# Patient Record
Sex: Female | Born: 1940 | Race: Asian | Hispanic: No | Marital: Married | Smoking: Never smoker
Health system: Southern US, Community
[De-identification: ages and names within clinical notes are randomized; demographics above are authoritative.]

## PROBLEM LIST (undated history)

## (undated) DIAGNOSIS — I10 Essential (primary) hypertension: Secondary | ICD-10-CM

---

## 2012-06-26 ENCOUNTER — Encounter (HOSPITAL_COMMUNITY): Payer: Self-pay

## 2012-06-26 ENCOUNTER — Emergency Department (HOSPITAL_COMMUNITY)
Admission: EM | Admit: 2012-06-26 | Discharge: 2012-06-26 | Disposition: A | Discharge status: Home / Self Care | Payer: Medicaid Other | Source: Home / Self Care | Attending: Family Medicine | Admitting: Family Medicine

## 2012-06-26 DIAGNOSIS — M545 Low back pain: Secondary | ICD-10-CM

## 2012-06-26 LAB — POCT URINALYSIS DIP (DEVICE)
Bilirubin Urine: NEGATIVE
Glucose, UA: NEGATIVE mg/dL
Hgb urine dipstick: NEGATIVE
Ketones, ur: NEGATIVE mg/dL
Nitrite: NEGATIVE

## 2012-06-26 MED ORDER — ACETAMINOPHEN 650 MG PO TABS
1.0000 | ORAL_TABLET | Freq: Three times a day (TID) | ORAL | Status: DC | PRN
Start: 2012-06-26 — End: 2013-05-09

## 2012-06-26 MED ORDER — CYCLOBENZAPRINE HCL 5 MG PO TABS: 5.0000 mg | ORAL_TABLET | Freq: Every evening | ORAL | Status: DC | PRN

## 2012-06-26 NOTE — ED Notes (Signed)
Patient complains of right hip pain x 1 week, states sometimes she has  spasms that come and go, states that pain increases when she sits or walks for a long period of time, pain radiates all the way down leg

## 2012-06-26 NOTE — ED Provider Notes (Signed)
History     CSN: [id number]  Arrival date & time 06/26/12  0844   First MD Initiated Contact with Patient 06/26/12 [phone]      Chief Complaint  Patient presents with  . Hip Pain    (Consider location/radiation/quality/duration/timing/severity/associated sxs/prior treatment) HPI Comments: 71 year old female with no significant past medical history. Here complaining of low back pain for several months but with exacerbation during the last week. Patient does not work outside the house but helps with cooking and some home chores. She reports that she exercises daily. She does not smoke. She reports that her pain is located in bilateral low back and sometimes have radiation to the right buttock and behind right thigh. Does not affect her activities of daily living. Pain does not wake her up at night. She's not taking any medication for her symptoms. Denies weakness, tingling and numbness in her lower extremities. No stool or urinary incontinence. No dysuria or hematuria. No urinary frequency. No abdominal pain nausea vomiting or diarrhea. No fever or chills. Appetite and energy level at baseline. Patient also has a blood pressure log with normal readings.   History reviewed. No pertinent past medical history.  History reviewed. No pertinent past surgical history.  No family history on file.  History  Substance Use Topics  . Smoking status: Never Smoker   . Smokeless tobacco: Not on file  . Alcohol Use: No    OB History    Grav Para Term Preterm Abortions TAB SAB Ect Mult Living                  Review of Systems  Constitutional: Negative for fever and chills.  Respiratory: Negative for shortness of breath.   Cardiovascular: Negative for chest pain and leg swelling.  Gastrointestinal: Negative for nausea, vomiting, abdominal pain and diarrhea.  Genitourinary: Negative for dysuria, hematuria, flank pain, vaginal discharge, vaginal pain and pelvic pain.  Musculoskeletal: Positive  for back pain. Negative for gait problem.  Skin: Negative for rash.  Neurological: Negative for dizziness and headaches.  All other systems reviewed and are negative.    Allergies  Review of patient's allergies indicates no known allergies.  Home Medications   Current Outpatient Rx  Name Route Sig Dispense Refill  . ACETAMINOPHEN 650 MG PO TABS Oral Take 1 tablet (650 mg total) by mouth 3 (three) times daily between meals as needed. 30 tablet 0  . CYCLOBENZAPRINE HCL 5 MG PO TABS Oral Take 1 tablet (5 mg total) by mouth at bedtime as needed for muscle spasms. 15 tablet 0    BP 165/76  Pulse 66  Temp 98.1 F (36.7 C) (Oral)  Resp 16  SpO2 100%  Physical Exam  Nursing note and vitals reviewed. Constitutional: She is oriented to person, place, and time. She appears well-developed and well-nourished. No distress.  HENT:  Head: Normocephalic and atraumatic.  Mouth/Throat: Oropharynx is clear and moist. No oropharyngeal exudate.  Eyes: Conjunctivae normal are normal. No scleral icterus.  Neck: Neck supple. No JVD present. No thyromegaly present.  Cardiovascular: Normal rate, regular rhythm and normal heart sounds.   Pulmonary/Chest: Effort normal and breath sounds normal.  Abdominal: Soft. Bowel sounds are normal. She exhibits no distension and no mass. There is no tenderness. There is no rebound and no guarding.       No costovertebral tenderness  Musculoskeletal:       Spine central: No obvious scoliosis or kyphosis. No pain over bone processes in entire spine.  Good  range of motion.  Able to bend forward (flexion) past 90 degrees and posteriorly (extension) 25 degrees.  Normal bilateral lateralization 25 degress. Despite good range of motion there is reported pain in bilateral paravertebral lumbar muscles.   Lower extremities with normal range of motion of bilateral hips and knees. Symmetric deep tendon reflexes in lower extremity as well as muscle strength, superficial  sensation and proprioception. Both lower extremities are equally warm and distal pedal pulses are patent bilaterally.    Lymphadenopathy:    She has no cervical adenopathy.  Neurological: She is alert and oriented to person, place, and time.  Skin: No rash noted. She is not diaphoretic.    ED Course  Procedures (including critical care time)  Labs Reviewed  POCT URINALYSIS DIP (DEVICE) - Abnormal; Notable for the following:    Leukocytes, UA SMALL (*)  Biochemical Testing Only. Please order routine urinalysis from main lab if confirmatory testing is needed.   All other components within normal limits  URINE CULTURE   No results found.   1. Low back pain       MDM  71 year old otherwise healthy female here complaining of low back pain. Positive LA on point-of-care urinalysis but no urinary symptoms. Decided to send urine for culture. Patient treated with acetaminophen when necessary and Flexeril minimal dose at bedtime only when necessary. Supportive care and exercises discussed with patient and also handout for back exercises provided. We will contact patient if abnormal urine culture.  Asked to followup with primary care provider to monitor her symptoms.        Sharin Grave, MD 06/26/12 [phone]

## 2012-06-27 LAB — URINE CULTURE
Colony Count: NO GROWTH
Special Requests: NORMAL

## 2013-03-24 ENCOUNTER — Ambulatory Visit: Payer: Self-pay

## 2013-04-18 ENCOUNTER — Ambulatory Visit: Payer: Medicaid Other | Attending: Family Medicine | Admitting: Internal Medicine

## 2013-04-18 ENCOUNTER — Encounter: Payer: Self-pay | Admitting: Internal Medicine

## 2013-04-18 ENCOUNTER — Ambulatory Visit (HOSPITAL_COMMUNITY)
Admission: RE | Admit: 2013-04-18 | Discharge: 2013-04-18 | Disposition: A | Discharge status: Home / Self Care | Payer: Medicaid Other | Source: Ambulatory Visit | Attending: Internal Medicine | Admitting: Internal Medicine

## 2013-04-18 VITALS — BP 167/95 | HR 64 | Temp 98.3°F | Ht <= 58 in | Wt 109.0 lb

## 2013-04-18 DIAGNOSIS — M4120 Other idiopathic scoliosis, site unspecified: Secondary | ICD-10-CM | POA: Insufficient documentation

## 2013-04-18 DIAGNOSIS — M549 Dorsalgia, unspecified: Secondary | ICD-10-CM | POA: Insufficient documentation

## 2013-04-18 DIAGNOSIS — M25559 Pain in unspecified hip: Secondary | ICD-10-CM | POA: Insufficient documentation

## 2013-04-18 DIAGNOSIS — Z7689 Persons encountering health services in other specified circumstances: Secondary | ICD-10-CM

## 2013-04-18 DIAGNOSIS — M545 Low back pain, unspecified: Secondary | ICD-10-CM | POA: Insufficient documentation

## 2013-04-18 DIAGNOSIS — M47817 Spondylosis without myelopathy or radiculopathy, lumbosacral region: Secondary | ICD-10-CM | POA: Insufficient documentation

## 2013-04-18 DIAGNOSIS — Z7189 Other specified counseling: Secondary | ICD-10-CM

## 2013-04-18 DIAGNOSIS — M948X9 Other specified disorders of cartilage, unspecified sites: Secondary | ICD-10-CM | POA: Insufficient documentation

## 2013-04-18 LAB — LIPID PANEL
Cholesterol: 228 mg/dL — ABNORMAL HIGH (ref 0–200)
HDL: 60 mg/dL (ref >39)
Triglycerides: 161 mg/dL — ABNORMAL HIGH (ref <150)
VLDL: 32 mg/dL (ref 0–40)

## 2013-04-18 LAB — COMPREHENSIVE METABOLIC PANEL
AST: 14 U/L (ref 0–37)
Alkaline Phosphatase: 45 U/L (ref 39–117)
BUN: 14 mg/dL (ref 6–23)
Calcium: 9.1 mg/dL (ref 8.4–10.5)
Chloride: 107 mEq/L (ref 96–112)
Creat: 0.81 mg/dL (ref 0.50–1.10)
Total Bilirubin: 0.5 mg/dL (ref 0.3–1.2)

## 2013-04-18 MED ORDER — MELOXICAM 15 MG PO TABS: 15.0000 mg | ORAL_TABLET | Freq: Every day | ORAL | Status: DC

## 2013-04-18 NOTE — Progress Notes (Signed)
Patient ID: Rebecca Weeks, female   DOB: [birth year], 72 y.o.   MRN: [id number]  CC:  HPI: 72 year old female who is here to establish care. She primarily complains of back pain originating in her left buttock and radiating into her left leg. She occasionally has a burning sensation back no numbness or tingling in her leg. Her gait is normal. The patient denies any history of trauma to her back. The patient states that she has difficulty and discomfort when she is sitting for prolonged periods of time. Rest of review of systems is negative  No Known Allergies No past medical history on file. Current Outpatient Prescriptions on File Prior to Visit  Medication Sig Dispense Refill  . Acetaminophen 650 MG TABS Take 1 tablet (650 mg total) by mouth 3 (three) times daily between meals as needed.  30 tablet  0  . cyclobenzaprine (FLEXERIL) 5 MG tablet Take 1 tablet (5 mg total) by mouth at bedtime as needed for muscle spasms.  15 tablet  0   No current facility-administered medications on file prior to visit.   No family history on file. History   Social History  . Marital Status: Married    Spouse Name: N/A    Number of Children: N/A  . Years of Education: N/A   Occupational History  . Not on file.   Social History Main Topics  . Smoking status: Never Smoker   . Smokeless tobacco: Not on file  . Alcohol Use: No  . Drug Use: No  . Sexual Activity:    Other Topics Concern  . Not on file   Social History Narrative  . No narrative on file    Review of Systems  Constitutional: Negative for fever, chills, diaphoresis, activity change, appetite change and fatigue.  HENT: Negative for ear pain, nosebleeds, congestion, facial swelling, rhinorrhea, neck pain, neck stiffness and ear discharge.   Eyes: Negative for pain, discharge, redness, itching and visual disturbance.  Respiratory: Negative for cough, choking, chest tightness, shortness of breath, wheezing and stridor.   Cardiovascular:  Negative for chest pain, palpitations and leg swelling.  Gastrointestinal: Negative for abdominal distention.  Genitourinary: Negative for dysuria, urgency, frequency, hematuria, flank pain, decreased urine volume, difficulty urinating and dyspareunia.  Musculoskeletal: Negative for back pain, joint swelling, arthralgias and gait problem.  Neurological: Negative for dizziness, tremors, seizures, syncope, facial asymmetry, speech difficulty, weakness, light-headedness, numbness and headaches.  Hematological: Negative for adenopathy. Does not bruise/bleed easily.  Psychiatric/Behavioral: Negative for hallucinations, behavioral problems, confusion, dysphoric mood, decreased concentration and agitation.    Objective:   Filed Vitals:   04/18/13 1011  BP: 167/95  Pulse: 64  Temp: 98.3 F (36.8 C)    Physical Exam  Constitutional: Appears well-developed and well-nourished. No distress.  HENT: Normocephalic. External right and left ear normal. Oropharynx is clear and moist.  Eyes: Conjunctivae and EOM are normal. PERRLA, no scleral icterus.  Neck: Normal ROM. Neck supple. No JVD. No tracheal deviation. No thyromegaly.  CVS: RRR, S1/S2 +, no murmurs, no gallops, no carotid bruit.  Pulmonary: Effort and breath sounds normal, no stridor, rhonchi, wheezes, rales.  Abdominal: Soft. BS +,  no distension, tenderness, rebound or guarding.  Musculoskeletal: Normal range of motion. No edema and no tenderness.  Lymphadenopathy: No lymphadenopathy noted, cervical, inguinal. Neuro: Alert. Normal reflexes, muscle tone coordination. No cranial nerve deficit. Skin: Skin is warm and dry. No rash noted. Not diaphoretic. No erythema. No pallor.  Psychiatric: Normal mood and affect. Behavior, judgment, thought  content normal.   No results found for this basename: WBC, HGB, HCT, MCV, PLT   No results found for this basename: CREATININE, BUN, NA, K, CL, CO2    No results found for this basename: HGBA1C    Lipid Panel  No results found for this basename: chol, trig, hdl, cholhdl, vldl, ldlcalc       Assessment and plan:   There are no active problems to display for this patient.  Back pain Obtain x-rays of the pelvis and the lumbar spine Start the patient on meloxicam   Obtain baseline labs today and have the patient come back in one week to review them Patient has never seen a primary care provider Therefore we'll schedule her for a mammography and therefore her to gastroenterology for colonoscopy

## 2013-04-19 LAB — CBC WITH DIFFERENTIAL/PLATELET
Basophils Absolute: 0 10*3/uL (ref 0.0–0.1)
Basophils Relative: 1 % (ref 0–1)
Eosinophils Absolute: 0.5 10*3/uL (ref 0.0–0.7)
Eosinophils Relative: 9 % — ABNORMAL HIGH (ref 0–5)
HCT: 34.7 % — ABNORMAL LOW (ref 36.0–46.0)
MCHC: 34.3 g/dL (ref 30.0–36.0)
MCV: 65.5 fL — ABNORMAL LOW (ref 78.0–100.0)
Monocytes Absolute: 0.2 10*3/uL (ref 0.1–1.0)
Platelets: 242 10*3/uL (ref 150–400)
RDW: 17.2 % — ABNORMAL HIGH (ref 11.5–15.5)

## 2013-04-24 ENCOUNTER — Encounter: Payer: Self-pay | Admitting: Internal Medicine

## 2013-04-25 ENCOUNTER — Ambulatory Visit: Payer: Medicaid Other

## 2013-05-08 ENCOUNTER — Ambulatory Visit: Payer: Medicaid Other | Attending: Internal Medicine

## 2013-05-09 ENCOUNTER — Ambulatory Visit: Payer: Medicaid Other | Attending: Internal Medicine

## 2013-05-09 VITALS — BP 176/74

## 2013-05-09 DIAGNOSIS — M549 Dorsalgia, unspecified: Secondary | ICD-10-CM

## 2013-05-09 DIAGNOSIS — E559 Vitamin D deficiency, unspecified: Secondary | ICD-10-CM | POA: Insufficient documentation

## 2013-05-09 DIAGNOSIS — E785 Hyperlipidemia, unspecified: Secondary | ICD-10-CM | POA: Insufficient documentation

## 2013-05-09 DIAGNOSIS — I10 Essential (primary) hypertension: Secondary | ICD-10-CM | POA: Insufficient documentation

## 2013-05-09 MED ORDER — AMLODIPINE BESYLATE 5 MG PO TABS: 5.0000 mg | ORAL_TABLET | Freq: Every day | ORAL | Status: DC

## 2013-05-09 MED ORDER — LOVASTATIN 20 MG PO TABS: 20.0000 mg | ORAL_TABLET | Freq: Every day | ORAL | Status: AC

## 2013-05-09 MED ORDER — VITAMIN D (ERGOCALCIFEROL) 1.25 MG (50000 UNIT) PO CAPS: 50000.0000 [IU] | ORAL_CAPSULE | ORAL | Status: AC

## 2013-05-09 MED ORDER — CYCLOBENZAPRINE HCL 5 MG PO TABS: 5.0000 mg | ORAL_TABLET | Freq: Two times a day (BID) | ORAL | Status: AC | PRN

## 2013-05-09 MED ORDER — CLONIDINE HCL 0.1 MG PO TABS
0.1000 mg | ORAL_TABLET | Freq: Once | ORAL | Status: AC
  Administered 2013-05-09: 0.1 mg via ORAL

## 2013-05-09 MED ORDER — TRAMADOL HCL 50 MG PO TABS: 50.0000 mg | ORAL_TABLET | Freq: Four times a day (QID) | ORAL | Status: DC | PRN

## 2013-05-09 NOTE — Progress Notes (Unsigned)
Rechecked BP = 176/74... Dr. Isidoro Donning has been adv of new results... Via interpreter, Pt reports feeling better... Per Dr. Isidoro Donning, pt is to wait 10 minutes and then she can leave... Pt verbalized understanding.

## 2013-05-09 NOTE — Progress Notes (Unsigned)
Patient ID: Rebecca Weeks, female   DOB: Jan 15, 1941, 72 y.o.   MRN: [id number] Patient Demographics  Rebecca Weeks, is a 72 y.o. female  WUJ:[id number]  NFA:[id number]  DOB - April 19, 1941  No chief complaint on file.       Subjective:   Rebecca Weeks today is here for a follow up visit.  Patient has No headache, No chest pain, No abdominal pain - No Nausea, No new weakness tingling or numbness, No Cough - SOB.   States her back pain has been still going on and that the pain medications are not working. No numbness or tingling in the right. X-rays reviewed with the patient.  Objective:    There were no vitals filed for this visit.   ALLERGIES:  No Known Allergies  PAST MEDICAL HISTORY: No past medical history on file.  MEDICATIONS AT HOME: Prior to Admission medications   Medication Sig Start Date End Date Taking? Authorizing Provider  cyclobenzaprine (FLEXERIL) 5 MG tablet Take 1 tablet (5 mg total) by mouth 2 (two) times daily as needed for muscle spasms (take one tab in AM and at bed time as needed). 05/09/13   Ripudeep Jenna Luo, MD  meloxicam (MOBIC) 15 MG tablet Take 1 tablet (15 mg total) by mouth daily. 04/18/13   Richarda Overlie, MD  traMADol (ULTRAM) 50 MG tablet Take 1 tablet (50 mg total) by mouth every 6 (six) hours as needed for pain. 05/09/13   Ripudeep Jenna Luo, MD  Vitamin D, Ergocalciferol, (DRISDOL) 50000 UNITS CAPS capsule Take 1 capsule (50,000 Units total) by mouth every 7 (seven) days. 05/09/13   Ripudeep Jenna Luo, MD     Exam  General appearance :Awake, alert, NAD, Speech Clear.  HEENT: Atraumatic and Normocephalic, PERLA Neck: supple, no JVD. No cervical lymphadenopathy.  Chest: Clear to auscultation bilaterally, no wheezing, rales or rhonchi CVS: S1 S2 regular, no murmurs.  Abdomen: soft, NBS, NT, ND, no gaurding, rigidity or rebound. Extremities: no cyanosis or clubbing, B/L Lower Ext shows no edema. SLR test negative Neurology: Awake alert, and oriented X 3, CN II-XII intact, Non  focal Skin: No Rash or lesions Wounds:N/A    Data Review   Basic Metabolic Panel: No results found for this basename: NA, K, CL, CO2, GLUCOSE, BUN, CREATININE, CALCIUM, MG, PHOS,  in the last 168 hours Liver Function Tests: No results found for this basename: AST, ALT, ALKPHOS, BILITOT, PROT, ALBUMIN,  in the last 168 hours  CBC: No results found for this basename: WBC, NEUTROABS, HGB, HCT, MCV, PLT,  in the last 168 hours  ------------------------------------------------------------------------------------------------------------------ No results found for this basename: HGBA1C,  in the last 72 hours ------------------------------------------------------------------------------------------------------------------ No results found for this basename: CHOL, HDL, LDLCALC, TRIG, CHOLHDL, LDLDIRECT,  in the last 72 hours ------------------------------------------------------------------------------------------------------------------ No results found for this basename: TSH, T4TOTAL, FREET3, T3FREE, THYROIDAB,  in the last 72 hours ------------------------------------------------------------------------------------------------------------------ No results found for this basename: VITAMINB12, FOLATE, FERRITIN, TIBC, IRON, RETICCTPCT,  in the last 72 hours  Coagulation profile  No results found for this basename: INR, PROTIME,  in the last 168 hours    Assessment & Plan   Active Problems: Back pain with hip pain and right leg pain: No significant improvement X-rays of the lumbar spine and pelvic x-rays reviewed with the patient - No acute fracture or dislocation, likely Osteoarthritis  - Placed on tramadol as needed, Flexeril twice a day as needed - Ambulate referred to orthopedics sent  Hyperlipidemia - Cholesterol 228 triglycerides 161, LDL 136  reviewed with the patient  - Start on Mevacor 20 mg daily   Vitamin D deficiency: - Vitamin D level 22, started on vitamin D 50,000  units weekly, continue for 8 weeks then recheck the levels and change to maintenance dose.  Hypertension: BP 159/102. Patient having some headache as well - Will give clonidine 0.1 mg x1 - Start patient on Norvasc 5 mg daily  Recommendations: Clonidine 0.1 mg x1  Follow-up in2 months     RAI,RIPUDEEP M.D. 05/09/2013, 2:43 PM

## 2013-05-20 ENCOUNTER — Ambulatory Visit: Payer: Medicaid Other | Admitting: Internal Medicine

## 2013-05-21 ENCOUNTER — Ambulatory Visit (HOSPITAL_COMMUNITY): Payer: Medicaid Other

## 2013-05-21 ENCOUNTER — Telehealth: Payer: Self-pay | Admitting: Internal Medicine

## 2013-05-21 NOTE — Telephone Encounter (Signed)
Message copied by Arna Snipe on Wed May 21, 2013  3:55 PM ------      Message from: Richardson Chiquito      Created: Tue May 20, 2013  1:41 PM                   ----- Message -----         From: Hart Carwin, MD         Sent: 05/20/2013  12:04 PM           To: Richardson Chiquito, CMA            Please charge no show      ----- Message -----         From: Richardson Chiquito, CMA         Sent: 05/20/2013  10:39 AM           To: Hart Carwin, MD            Patient no showed appointment with Dr Juanda Chance on 05/20/16. Dr Juanda Chance, do you want to charge no show fee?       ------

## 2014-01-06 ENCOUNTER — Other Ambulatory Visit: Payer: Self-pay | Admitting: Internal Medicine

## 2016-06-28 ENCOUNTER — Telehealth: Payer: Self-pay

## 2016-06-28 NOTE — Telephone Encounter (Signed)
Patient called interpreter Diu Hartshorn to state [facility] [place]  She is out of BP medication and is having pain in stomach and Rt. Hip.  CN called for appointment but will have to call back because the computer system was down.  Brantley Flingarolyn O'Brien RN, Congregational Nurse [phone]

## 2016-06-29 ENCOUNTER — Telehealth: Payer: Self-pay

## 2016-06-29 NOTE — Telephone Encounter (Signed)
CN called [facility] And scheduled appointment for 07/12/2016 @ 3:15 pm.  Also notified interpreter Diu Hartshorn who will accompany patient to appointment.  Brantley Flingarolyn O'Brien RN, Congregational Nurse [phone]

## 2016-11-08 NOTE — Congregational Nurse Program (Signed)
Congregational Nurse Program Note  Date of Encounter: 11/08/2016  Past Medical History: No past medical history on file.  Encounter Details:  Patient came to CN office because he is having problems with his lower dentures.  States they hurt and do not fit properly.  He is only able to eat soft foods.  He does not know where he originally obtained his dentures because he was taken there by an interpreter.  His PCP referred him to " a Education officer, communitydentist on [address]  Phone call to Dr. Aline BrochureStacey Greene's office.  An appointment was scheduled for 11/15/2016 at 2:30 pm to evaluate problem.  Brantley FlingCarolyn O'Brien RN, Congregational Nurse [phone]     CNP Questionnaire - 11/08/16 1916      Patient Demographics   Is this a new or existing patient? Existing   Patient is considered a/an Refugee   Race Asian     Patient Assistance   Location of Patient Assistance Not Applicable   Patient's financial/insurance status Medicaid;Medicare   Uninsured Patient ([employer] officer, trade unionCard/[employer]s) No   Patient referred to apply for the following financial assistance Not Applicable   Food insecurities addressed Not Applicable   Transportation assistance No   Assistance securing medications No   Educational health offerings Not Applicable     Encounter Details   Primary purpose of visit Navigating the Healthcare System   Was an Emergency Department visit averted? No   Does patient have a medical provider? Yes   Patient referred to Follow up with established PCP;Not Applicable   Was a mental health screening completed? (GAINS tool) No   Does patient have dental issues? No   Does patient have vision issues? No   Does your patient have an abnormal blood pressure today? No   Since previous encounter, have you referred patient for abnormal blood pressure that resulted in a new diagnosis or medication change? No   Does your patient have an abnormal blood glucose today? No   Since previous encounter, have you referred patient  for abnormal blood glucose that resulted in a new diagnosis or medication change? No   Was there a life-saving intervention made? No

## 2019-01-15 ENCOUNTER — Emergency Department (HOSPITAL_COMMUNITY): Payer: Medicare Other

## 2019-01-15 ENCOUNTER — Inpatient Hospital Stay (HOSPITAL_COMMUNITY)
Admission: EM | Admit: 2019-01-15 | Discharge: 2019-01-18 | DRG: 177 | Disposition: A | Discharge status: Home / Self Care | Payer: Medicare Other | Attending: Internal Medicine | Admitting: Internal Medicine

## 2019-01-15 ENCOUNTER — Other Ambulatory Visit: Payer: Self-pay

## 2019-01-15 DIAGNOSIS — J1289 Other viral pneumonia: Secondary | ICD-10-CM | POA: Diagnosis present

## 2019-01-15 DIAGNOSIS — R509 Fever, unspecified: Secondary | ICD-10-CM | POA: Diagnosis present

## 2019-01-15 DIAGNOSIS — Z79899 Other long term (current) drug therapy: Secondary | ICD-10-CM | POA: Diagnosis not present

## 2019-01-15 DIAGNOSIS — Z791 Long term (current) use of non-steroidal anti-inflammatories (NSAID): Secondary | ICD-10-CM | POA: Diagnosis not present

## 2019-01-15 DIAGNOSIS — I10 Essential (primary) hypertension: Secondary | ICD-10-CM | POA: Diagnosis present

## 2019-01-15 DIAGNOSIS — J129 Viral pneumonia, unspecified: Secondary | ICD-10-CM

## 2019-01-15 DIAGNOSIS — Z20822 Contact with and (suspected) exposure to covid-19: Secondary | ICD-10-CM | POA: Diagnosis present

## 2019-01-15 DIAGNOSIS — Z79891 Long term (current) use of opiate analgesic: Secondary | ICD-10-CM

## 2019-01-15 DIAGNOSIS — U071 COVID-19: Secondary | ICD-10-CM | POA: Diagnosis present

## 2019-01-15 HISTORY — DX: Essential (primary) hypertension: I10

## 2019-01-15 LAB — COMPREHENSIVE METABOLIC PANEL
ALT: 25 U/L (ref 0–44)
AST: 36 U/L (ref 15–41)
Albumin: 3.4 g/dL — ABNORMAL LOW (ref 3.5–5.0)
Alkaline Phosphatase: 56 U/L (ref 38–126)
Anion gap: 11 (ref 5–15)
BUN: 16 mg/dL (ref 8–23)
CO2: 21 mmol/L — ABNORMAL LOW (ref 22–32)
Calcium: 8.3 mg/dL — ABNORMAL LOW (ref 8.9–10.3)
Chloride: 105 mmol/L (ref 98–111)
Creatinine, Ser: 0.93 mg/dL (ref 0.44–1.00)
GFR calc Af Amer: >60 mL/min (ref >60)
GFR calc non Af Amer: 59 mL/min — ABNORMAL LOW (ref >60)
Glucose, Bld: 122 mg/dL — ABNORMAL HIGH (ref 70–99)
Potassium: 2.9 mmol/L — ABNORMAL LOW (ref 3.5–5.1)
Sodium: 137 mmol/L (ref 135–145)
Total Bilirubin: 0.5 mg/dL (ref 0.3–1.2)
Total Protein: 6.8 g/dL (ref 6.5–8.1)

## 2019-01-15 LAB — CBC
HCT: 31.4 % — ABNORMAL LOW (ref 36.0–46.0)
Hemoglobin: 10.9 g/dL — ABNORMAL LOW (ref 12.0–15.0)
MCH: 22.8 pg — ABNORMAL LOW (ref 26.0–34.0)
MCHC: 34.7 g/dL (ref 30.0–36.0)
MCV: 65.7 fL — ABNORMAL LOW (ref 80.0–100.0)
Platelets: 170 10*3/uL (ref 150–400)
RBC: 4.78 MIL/uL (ref 3.87–5.11)
RDW: 15 % (ref 11.5–15.5)
WBC: 6 10*3/uL (ref 4.0–10.5)
nRBC: 0 % (ref 0.0–0.2)

## 2019-01-15 LAB — SARS CORONAVIRUS 2 BY RT PCR (HOSPITAL ORDER, PERFORMED IN ~~LOC~~ HOSPITAL LAB): SARS Coronavirus 2: NEGATIVE

## 2019-01-15 MED ORDER — POTASSIUM CHLORIDE CRYS ER 20 MEQ PO TBCR
40.0000 meq | EXTENDED_RELEASE_TABLET | Freq: Once | ORAL | Status: DC
  Filled 2019-01-15: qty 2

## 2019-01-15 MED ORDER — OXYCODONE HCL 5 MG PO TABS
5.0000 mg | ORAL_TABLET | Freq: Once | ORAL | Status: AC
  Administered 2019-01-15: 5 mg via ORAL
  Filled 2019-01-15: qty 1

## 2019-01-15 MED ORDER — SODIUM CHLORIDE 0.9 % IV BOLUS
1000.0000 mL | Freq: Once | INTRAVENOUS | Status: AC
  Administered 2019-01-16: 1000 mL via INTRAVENOUS

## 2019-01-15 NOTE — ED Notes (Signed)
[phone] - Diu..... translates if needed

## 2019-01-15 NOTE — ED Triage Notes (Signed)
Pt reports with fever and right leg pain for 3 days. Husband went to [place] yesterday with COVID. Pt denies CP, SOB and cough

## 2019-01-15 NOTE — ED Provider Notes (Signed)
[facility] Emergency Department Provider Note MRN:  [id number]  Arrival date & time: 01/16/19     Chief Complaint   Fever   History of Present Illness   Rebecca Weeks is a 78 y.o. year-old female with no pertinent past medical history presenting to the ED with chief complaint of fever.  2 to 3 days of persistent fever, general malaise.  History obtained with the help of a family friend over the phone who is able to interpret.  Patient denying cough, no shortness of breath, no chest pain.  Endorsing right lower quadrant/right flank/right hip pain.  Patient has had chronic hip and leg pain for several years, but this is different and/or worse.  Denies burning with urination, no bowel or bladder dysfunction, no numbness or weakness to the arms or legs.  Symptoms are constant, moderate, no exacerbating relieving factors.  Review of Systems  A complete 10 system review of systems was obtained and all systems are negative except as noted in the HPI and PMH.   Patient's Health History   No past medical history on file.  No past surgical history on file.  No family history on file.  Social History   Socioeconomic History  . Marital status: Married    Spouse name: Not on file  . Number of children: Not on file  . Years of education: Not on file  . Highest education level: Not on file  Occupational History  . Not on file  Social Needs  . Financial resource strain: Not on file  . Food insecurity:    Worry: Not on file    Inability: Not on file  . Transportation needs:    Medical: Not on file    Non-medical: Not on file  Tobacco Use  . Smoking status: Never Smoker  Substance and Sexual Activity  . Alcohol use: No  . Drug use: No  . Sexual activity: Not on file  Lifestyle  . Physical activity:    Days per week: Not on file    Minutes per session: Not on file  . Stress: Not on file  Relationships  . Social connections:    Talks on phone: Not on file    Gets  together: Not on file    Attends religious service: Not on file    Active member of club or organization: Not on file    Attends meetings of clubs or organizations: Not on file    Relationship status: Not on file  . Intimate partner violence:    Fear of current or ex partner: Not on file    Emotionally abused: Not on file    Physically abused: Not on file    Forced sexual activity: Not on file  Other Topics Concern  . Not on file  Social History Narrative  . Not on file     Physical Exam  Vital Signs and Nursing Notes reviewed Vitals:   01/15/19 2026  BP: 124/74  Pulse: (!) 104  Resp: 20  Temp: 99.5 F (37.5 C)  SpO2: 94%    CONSTITUTIONAL: Well-appearing, NAD NEURO:  Alert and oriented x 3, no focal deficits EYES:  eyes equal and reactive ENT/NECK:  no LAD, no JVD CARDIO: Regular rate, well-perfused, normal S1 and S2 PULM:  CTAB no wheezing or rhonchi GI/GU:  normal bowel sounds, non-distended, non-tender MSK/SPINE:  No gross deformities, no edema SKIN:  no rash, atraumatic PSYCH:  Appropriate speech and behavior  Diagnostic and Interventional Summary  EKG Interpretation  Date/Time:  Wednesday Jan 15 2019 20:24:53 EDT Ventricular Rate:  93 PR Interval:    QRS Duration: 86 QT Interval:  443 QTC Calculation: 552 R Axis:   45 Text Interpretation:  Sinus rhythm Ventricular premature complex Aberrant conduction of SV complex(es) Borderline short PR interval Nonspecific T abnormalities, lateral leads Prolonged QT interval Confirmed by Kennis CarinaBero, Sherrise Liberto [phone]([id number]) on 01/15/2019 8:44:34 PM      Labs Reviewed  CBC - Abnormal; Notable for the following components:      Result Value   Hemoglobin 10.9 (*)    HCT 31.4 (*)    MCV 65.7 (*)    MCH 22.8 (*)    All other components within normal limits  COMPREHENSIVE METABOLIC PANEL - Abnormal; Notable for the following components:   Potassium 2.9 (*)    CO2 21 (*)    Glucose, Bld 122 (*)    Calcium 8.3 (*)    Albumin 3.4  (*)    GFR calc non Af Amer 59 (*)    All other components within normal limits  SARS CORONAVIRUS 2 (HOSPITAL ORDER, PERFORMED IN [place] [facility])  URINALYSIS, ROUTINE W REFLEX MICROSCOPIC    CT ABDOMEN PELVIS WO CONTRAST  Final Result    DG Chest Port 1 View  Final Result      Medications  potassium chloride SA (K-DUR) CR tablet 40 mEq (has no administration in time range)  sodium chloride 0.9 % bolus 1,000 mL (has no administration in time range)  oxyCODONE (Oxy IR/ROXICODONE) immediate release tablet 5 mg (5 mg Oral Given 01/15/19 2215)     Procedures Critical Care  ED Course and Medical Decision Making  I have reviewed the triage vital signs and the nursing notes.  Pertinent labs & imaging results that were available during my care of the patient were reviewed by me and considered in my medical decision making (see below for details).  Favoring coronavirus infection causing the fever, but also considering UTI, pyelonephritis, infected stone, less likely appendicitis.  Patient is without any tenderness or swelling to the right lower extremity, nothing to suggest infection, no evidence of DVT, no concern for septic joint.  Awaiting labs, chest x-ray, CT abdomen.  CT is unremarkable.  Still awaiting urinalysis.  Patient became lightheaded upon standing, will provide IV fluids and reassess ambulation status.  Plan discussed with the help of her family friend who interpreted.  Patient made aware of her low potassium, her ovarian cyst that needs outpatient follow-up ultrasound.  Signed out to oncoming provider at shift change.  Elmer SowMichael M. Pilar PlateBero, MD [facility] [email or web address]  Final Clinical Impressions(s) / ED Diagnoses     ICD-10-CM   1. Viral pneumonia J12.9   2. Fever R50.9 [facility]    DG Chest Port 1 View    CANCELED: DG Chest 2 View    CANCELED: DG Chest 2 View    ED Discharge Orders    None          Sabas SousBero, Lenus Trauger M, MD 01/16/19 [phone]

## 2019-01-15 NOTE — ED Notes (Signed)
Pt reports speaking [place], different from [place]

## 2019-01-16 ENCOUNTER — Encounter (HOSPITAL_COMMUNITY): Payer: Self-pay | Admitting: Internal Medicine

## 2019-01-16 DIAGNOSIS — Z791 Long term (current) use of non-steroidal anti-inflammatories (NSAID): Secondary | ICD-10-CM | POA: Diagnosis not present

## 2019-01-16 DIAGNOSIS — Z79899 Other long term (current) drug therapy: Secondary | ICD-10-CM | POA: Diagnosis not present

## 2019-01-16 DIAGNOSIS — Z20822 Contact with and (suspected) exposure to covid-19: Secondary | ICD-10-CM | POA: Diagnosis present

## 2019-01-16 DIAGNOSIS — Z79891 Long term (current) use of opiate analgesic: Secondary | ICD-10-CM | POA: Diagnosis not present

## 2019-01-16 DIAGNOSIS — R6889 Other general symptoms and signs: Secondary | ICD-10-CM | POA: Diagnosis not present

## 2019-01-16 DIAGNOSIS — I10 Essential (primary) hypertension: Secondary | ICD-10-CM

## 2019-01-16 DIAGNOSIS — R509 Fever, unspecified: Secondary | ICD-10-CM | POA: Diagnosis present

## 2019-01-16 DIAGNOSIS — J1289 Other viral pneumonia: Secondary | ICD-10-CM | POA: Diagnosis not present

## 2019-01-16 DIAGNOSIS — U071 COVID-19: Secondary | ICD-10-CM | POA: Diagnosis not present

## 2019-01-16 LAB — C-REACTIVE PROTEIN: CRP: 5.1 mg/dL — ABNORMAL HIGH (ref <1.0)

## 2019-01-16 LAB — ABO/RH: ABO/RH(D): A POS

## 2019-01-16 LAB — SARS CORONAVIRUS 2 BY RT PCR (HOSPITAL ORDER, PERFORMED IN ~~LOC~~ HOSPITAL LAB): SARS Coronavirus 2: POSITIVE — AB

## 2019-01-16 LAB — PROCALCITONIN: Procalcitonin: 0.14 ng/mL

## 2019-01-16 LAB — URINALYSIS, ROUTINE W REFLEX MICROSCOPIC
Bilirubin Urine: NEGATIVE
Glucose, UA: NEGATIVE mg/dL
Hgb urine dipstick: NEGATIVE
Ketones, ur: 5 mg/dL — AB
Leukocytes,Ua: NEGATIVE
Nitrite: NEGATIVE
Protein, ur: NEGATIVE mg/dL
Specific Gravity, Urine: 1.011 (ref 1.005–1.030)
pH: 6 (ref 5.0–8.0)

## 2019-01-16 LAB — TYPE AND SCREEN
ABO/RH(D): A POS
Antibody Screen: NEGATIVE

## 2019-01-16 LAB — D-DIMER, QUANTITATIVE: D-Dimer, Quant: 0.64 ug/mL-FEU — ABNORMAL HIGH (ref 0.00–0.50)

## 2019-01-16 MED ORDER — GUAIFENESIN-DM 100-10 MG/5ML PO SYRP
10.0000 mL | ORAL_SOLUTION | ORAL | Status: DC | PRN
  Administered 2019-01-16 – 2019-01-17 (×2): 10 mL via ORAL
  Filled 2019-01-16 (×2): qty 10

## 2019-01-16 MED ORDER — POTASSIUM CHLORIDE CRYS ER 20 MEQ PO TBCR
40.0000 meq | EXTENDED_RELEASE_TABLET | Freq: Once | ORAL | Status: AC
  Administered 2019-01-16: 40 meq via ORAL
  Filled 2019-01-16: qty 2

## 2019-01-16 MED ORDER — ONDANSETRON HCL 4 MG/2ML IJ SOLN
4.0000 mg | Freq: Once | INTRAMUSCULAR | Status: AC
  Administered 2019-01-16: 4 mg via INTRAVENOUS
  Filled 2019-01-16: qty 2

## 2019-01-16 MED ORDER — HYDROCOD POLST-CPM POLST ER 10-8 MG/5ML PO SUER: 5.0000 mL | Freq: Two times a day (BID) | ORAL | Status: DC | PRN

## 2019-01-16 MED ORDER — ONDANSETRON HCL 4 MG/2ML IJ SOLN: 4.0000 mg | Freq: Four times a day (QID) | INTRAMUSCULAR | Status: DC | PRN

## 2019-01-16 MED ORDER — ONDANSETRON HCL 4 MG PO TABS: 4.0000 mg | ORAL_TABLET | Freq: Four times a day (QID) | ORAL | Status: DC | PRN

## 2019-01-16 MED ORDER — POTASSIUM CHLORIDE 10 MEQ/100ML IV SOLN
10.0000 meq | INTRAVENOUS | Status: AC
  Administered 2019-01-16 (×4): 10 meq via INTRAVENOUS
  Filled 2019-01-16 (×4): qty 100

## 2019-01-16 MED ORDER — SODIUM CHLORIDE 0.9 % IV SOLN
INTRAVENOUS | Status: DC
  Administered 2019-01-16 (×2): via INTRAVENOUS

## 2019-01-16 MED ORDER — ACETAMINOPHEN 325 MG PO TABS
650.0000 mg | ORAL_TABLET | Freq: Four times a day (QID) | ORAL | Status: DC | PRN
  Administered 2019-01-16 – 2019-01-18 (×4): 650 mg via ORAL
  Filled 2019-01-16 (×5): qty 2

## 2019-01-16 MED ORDER — ENOXAPARIN SODIUM 40 MG/0.4ML ~~LOC~~ SOLN
40.0000 mg | Freq: Every day | SUBCUTANEOUS | Status: DC
  Administered 2019-01-16 – 2019-01-18 (×3): 40 mg via SUBCUTANEOUS
  Filled 2019-01-16 (×3): qty 0.4

## 2019-01-16 NOTE — Progress Notes (Signed)
This is a no charge note as patient admitted earlier today by Dr. Julian Reil  78 year old female admitted this morning for nausea, vomiting.  Her husband currently at [facility] for COVID-19 initial testing where negative for COVID-19, but repeat test came back positive, complaint was nausea and vomiting on presentation, CT abdomen pelvis with no acute finding in the abdomen , but significant for pulmonary finding suspicious of viral pneumonia related to COVID-19 .  Laying In bed, no apparent distress  Lungs Clear to auscultation, no wheezing or rails  Regular Rate and rhythm, no rubs or gallops  Abd Soft, nontender, nondistended  Ext with No Edema, clubbing or cyanosis   Patient saturating 96% on room air, and nausea subsided, tolerated her diet earlier today, continue to follow inflammatory markers, so far no indication for steroids or Skipper Cliche, will continue to monitor closely.  Time spent : No Charge   Huey Bienenstock MD

## 2019-01-16 NOTE — Progress Notes (Addendum)
78 year old female admitted this morning for nausea, vomiting. Her husband was recently tested positive for COVID 19. See detailed H&P by Dr. Julian Reil Initial SARS coronavirus 2 test was negative in the ED.   Repeat testing performed on the floor showed  SARS coronavirus 2  positive,  D dimer 0.64, CRP 5.1. Patient is not requiring oxygen,  O2 sats 96% on room air. CT abd/pelvis showed bilateral infiltrates in lungs Will obtain procalcitonin this morning. Potassium was replaced last night, today's labs are pending.  We will transfer the patient to [facility] Patient will Karlynn seen and examined at [facility] and discussed with with my colleague Dr. Randol Kern

## 2019-01-16 NOTE — ED Notes (Signed)
Pt ambulated to the bathroom but became very dizzy and vomited. Provider notified. IV replaced and fluids given. Pt refusing PO meds until nausea is under control, provider notified.

## 2019-01-16 NOTE — H&P (Signed)
History and Physical    Rebecca Weeks HLK:[id number] DOB: [birth year] DOA: 01/15/2019  PCP: Patient, [name]  Patient coming from: Home  I have personally briefly reviewed patient's old medical records in [facility]  Chief Complaint: Fever  HPI: Rebecca Weeks is a 78 y.o. female with medical history significant of HTN, though her BP looks okay today and she doesn't take any of her BP meds at baseline it seems.  Patient presents to the ED with 2-3 day history of fever, malaise.  Denies cough or SOB.  Does have nausea, vomiting, and lightheadedness.  Husband has confirmed COVID-19 and is currently admitted to [facility].   ED Course: CT abd/pelvis is significant for B PNA findings worrisome for COVID-19 (see report), otherwise negative.  COVID-19 test is surprisingly negative.  Patient has dizziness and vomiting when ambulating to bathroom.  History taken with help of translator over phone, patient speaks [place] Rhade   Review of Systems: As per HPI otherwise 10 point review of systems negative.   Past Medical History:  Diagnosis Date   HTN (hypertension)     History reviewed. No pertinent surgical history.   reports that she has never smoked. She does not have any smokeless tobacco history on file. She reports that she does not drink alcohol or use drugs.  No Known Allergies  No family history on file. Husband has COVID-19 currently  Prior to Admission medications   Medication Sig Start Date End Date Taking? Authorizing Provider  acetaminophen (TYLENOL) 650 MG CR tablet Take 650 mg by mouth every 8 (eight) hours as needed for pain.   Yes [provider]  amLODipine (NORVASC) 5 MG tablet Take 1 tablet (5 mg total) by mouth daily. Patient not taking: Reported on 01/15/2019 05/09/13   Rai, Delene Ruffini, MD  cyclobenzaprine (FLEXERIL) 5 MG tablet Take 1 tablet (5 mg total) by mouth 2 (two) times daily as needed for muscle spasms (take one tab in AM and at bed time as  needed). Patient not taking: Reported on 01/15/2019 05/09/13   Rai, Delene Ruffini, MD  lovastatin (MEVACOR) 20 MG tablet Take 1 tablet (20 mg total) by mouth at bedtime. Patient not taking: Reported on 01/15/2019 05/09/13   Rai, Delene Ruffini, MD  meloxicam (MOBIC) 15 MG tablet Take 1 tablet (15 mg total) by mouth daily. Patient not taking: Reported on 01/15/2019 04/18/13   Richarda Overlie, MD  traMADol (ULTRAM) 50 MG tablet Take 1 tablet (50 mg total) by mouth every 6 (six) hours as needed for pain. Patient not taking: Reported on 01/15/2019 05/09/13   Rai, Delene Ruffini, MD  Vitamin D, Ergocalciferol, (DRISDOL) 50000 UNITS CAPS capsule Take 1 capsule (50,000 Units total) by mouth every 7 (seven) days. Patient not taking: Reported on 01/15/2019 05/09/13   Cathren Harsh, MD    Physical Exam: Vitals:   01/15/19 2026 01/15/19 2042 01/16/19 0047  BP: 124/74  127/70  Pulse: (!) 104  83  Resp: 20  19  Temp: 99.5 F (37.5 C)  98.4 F (36.9 C)  TempSrc: Oral  Oral  SpO2: 94%  95%  Weight:  51 kg   Height:  5\' 1"  (1.549 m)     Constitutional: NAD, calm, comfortable Eyes: PERRL, lids and conjunctivae normal ENMT: Mucous membranes are moist. Posterior pharynx clear of any exudate or lesions.Normal dentition.  Neck: normal, supple, no masses, no thyromegaly Respiratory: clear to auscultation bilaterally, no wheezing, no crackles. Normal respiratory effort. No accessory muscle use.  Cardiovascular: Regular rate and rhythm, no murmurs / rubs / gallops. No extremity edema. 2+ pedal pulses. No carotid bruits.  Abdomen: no tenderness, no masses palpated. No hepatosplenomegaly. Bowel sounds positive.  Musculoskeletal: no clubbing / cyanosis. No joint deformity upper and lower extremities. Good ROM, no contractures. Normal muscle tone.  Skin: no rashes, lesions, ulcers. No induration Neurologic: MAE, grossly non-focal Psychiatric: Normal judgment and insight. Alert and oriented x 3. Normal mood.    Labs on Admission:  I have personally reviewed following labs and imaging studies  CBC: Recent Labs  Lab 01/15/19 2104  WBC 6.0  HGB 10.9*  HCT 31.4*  MCV 65.7*  PLT 170   Basic Metabolic Panel: Recent Labs  Lab 01/15/19 2104  NA 137  K 2.9*  CL 105  CO2 21*  GLUCOSE 122*  BUN 16  CREATININE 0.93  CALCIUM 8.3*   GFR: Estimated Creatinine Clearance: 37.6 mL/min (by C-G formula based on SCr of 0.93 mg/dL). Liver Function Tests: Recent Labs  Lab 01/15/19 2104  AST 36  ALT 25  ALKPHOS 56  BILITOT 0.5  PROT 6.8  ALBUMIN 3.4*   No results for input(s): LIPASE, AMYLASE in the last 168 hours. No results for input(s): AMMONIA in the last 168 hours. Coagulation Profile: No results for input(s): INR, PROTIME in the last 168 hours. Cardiac Enzymes: No results for input(s): CKTOTAL, CKMB, CKMBINDEX, TROPONINI in the last 168 hours. BNP (last 3 results) No results for input(s): PROBNP in the last 8760 hours. HbA1C: No results for input(s): HGBA1C in the last 72 hours. CBG: No results for input(s): GLUCAP in the last 168 hours. Lipid Profile: No results for input(s): CHOL, HDL, LDLCALC, TRIG, CHOLHDL, LDLDIRECT in the last 72 hours. Thyroid Function Tests: No results for input(s): TSH, T4TOTAL, FREET4, T3FREE, THYROIDAB in the last 72 hours. Anemia Panel: No results for input(s): VITAMINB12, FOLATE, FERRITIN, TIBC, IRON, RETICCTPCT in the last 72 hours. Urine analysis:    Component Value Date/Time   COLORURINE YELLOW 01/15/2019 2104   APPEARANCEUR CLEAR 01/15/2019 2104   LABSPEC 1.011 01/15/2019 2104   PHURINE 6.0 01/15/2019 2104   GLUCOSEU NEGATIVE 01/15/2019 2104   HGBUR NEGATIVE 01/15/2019 2104   BILIRUBINUR NEGATIVE 01/15/2019 2104   KETONESUR 5 (A) 01/15/2019 2104   PROTEINUR NEGATIVE 01/15/2019 2104   UROBILINOGEN 0.2 06/26/2012 1009   NITRITE NEGATIVE 01/15/2019 2104   LEUKOCYTESUR NEGATIVE 01/15/2019 2104    Radiological Exams on Admission: Ct Abdomen Pelvis Wo  Contrast  Result Date: 01/15/2019 CLINICAL DATA:  Fever and right lower quadrant pain. EXAM: CT ABDOMEN AND PELVIS WITHOUT CONTRAST TECHNIQUE: Multidetector CT imaging of the abdomen and pelvis was performed following the standard protocol without IV contrast. COMPARISON:  None. FINDINGS: Lower chest: Peripheral ground-glass opacities are seen in the lower lobes bilaterally and in the periphery of the lingula and right middle lobe. No pleural effusion. There is cardiomegaly. Hepatobiliary: No focal liver abnormality is seen. No gallstones, gallbladder wall thickening, or biliary dilatation. Pancreas: Unremarkable. No pancreatic ductal dilatation or surrounding inflammatory changes. Spleen: Normal in size without focal abnormality. Adrenals/Urinary Tract: Adrenal glands are unremarkable. Kidneys are normal, without renal calculi, focal lesion, or hydronephrosis. Bladder is unremarkable. Stomach/Bowel: Stomach is within normal limits. Appendix appears normal. No evidence of bowel wall thickening, distention, or inflammatory changes. Vascular/Lymphatic: Aortic atherosclerosis. No enlarged abdominal or pelvic lymph nodes. Reproductive: A cystic lesion left adnexa measures 3.8 cm AP x 5.3 cm transverse x 3.6 cm craniocaudal. Uterus and right ovary are  unremarkable. Other: None. Musculoskeletal: No acute or focal abnormality. IMPRESSION: There are a spectrum of findings in the lungs which can Mckinlee seen with acute atypical infection (as well as other non-infectious etiologies). In particular, viral pneumonia (including COVID-19) should Yiselle considered in the appropriate clinical setting. Review of the patient's chart shows she is currently being tested for COVID-19. No acute abnormality abdomen or pelvis. Cystic lesion left adnexa cannot Tysheka definitively characterized. Nonemergent pelvic ultrasound is recommended for characterization. This recommendation follows ACR consensus guidelines: White Paper of the ACR Incidental  Findings Committee II on Adnexal Findings. J Am Coll Radiol [id number]. Electronically Signed   By: Drusilla Kannerhomas  Dalessio M.D.   On: 01/15/2019 23:13   Dg Chest Port 1 View  Result Date: 01/15/2019 CLINICAL DATA:  Fever EXAM: PORTABLE CHEST 1 VIEW COMPARISON:  02/07/2008 FINDINGS: The cardiac silhouette is significantly enlarged. There are hazy bilateral airspace opacities, for example at the left lung base and in the peripheral mid lung zones bilaterally. There is no pneumothorax. No large pleural effusion. IMPRESSION: 1. Hazy bilateral airspace opacities as detailed above, highly concerning for an atypical (viral) pneumonia. 2. Cardiomegaly. Electronically Signed   By: Katherine Mantlehristopher  Green M.D.   On: 01/15/2019 21:28    EKG: Independently reviewed.  Assessment/Plan Principal Problem:   Suspected Covid-19 Virus Infection Active Problems:   Essential hypertension, benign    1. Suspected COVID-19 1. Despite negative COVID PCR, still suspect this on basis of high risk exposure as well as CT scan findings. 2. Repeat COVID PCR ordered (hopefully our lab does this). 3. COVID pathway 4. zofran PRN nausea 5. IVF: NS at 100 6. Replace potassium 7. Repeat CBC/CMP in AM 8. Checking CRP, D.Dimer 9. Cont pulse ox 2. HTN - 1. Seems like patient doesn't take BP meds at baseline 2. And today BP looks fine (120s/70s range) 3. So will continue to hold for the moment  DVT prophylaxis: Lovenox Code Status: Full Family Communication: No family in room, used translator over phone Disposition Plan: Home after admit Consults called: None Admission status: Place in obs    Greycen Felter M. DO [facility]  How to contact the [facility] Attending or Consulting provider 7A - 7P or covering provider during after hours 7P -7A, for this patient?  1. Check the care team in [facility] and look for a) attending/consulting [facility] provider listed and b) the [facility] team listed 2. Log into [email or web address]  Amion Physician  Scheduling and messaging for groups and whole hospitals  On call and physician scheduling software for group practices, residents, hospitalists and other medical providers for call, clinic, rotation and shift schedules. OnCall Enterprise is a hospital-wide system for scheduling doctors and paging doctors on call. EasyPlot is for scientific plotting and data analysis.  [email or web address]  and use [place]'s universal password to access. If you do not have the password, please contact the hospital operator.  3. Locate the [facility] provider you are looking for under [facility] and page to a number that you can Yalexa directly reached. 4. If you still have difficulty reaching the provider, please page the [facility] (Director on Call) for the Hospitalists listed on amion for assistance.  01/16/2019, 2:12 AM

## 2019-01-16 NOTE — ED Provider Notes (Signed)
Care assumed from Dr. Pilar Plate at shift change.  Patient presenting with fever and malaise for the past several days.  Husband currently hospitalized at [facility] for [place].  Patient's presentation, workup most concerning for Covid-19, however nasal swab was negative.  Patient continues with weakness, dizziness, and vomiting with ambulation.  Will admit for additional care.  Dr. Julian Reil agrees to admit.   Geoffery Lyons, MD 01/16/19 [phone]

## 2019-01-16 NOTE — ED Notes (Signed)
Pt resting in bed NAD 

## 2019-01-16 NOTE — Progress Notes (Addendum)
Patients family friend Mallie Mussel [phone] [facility] updated regarding transfer to [facility]. Some health history information updated, unable to complete all. Fax sent, Report called to CN at [facility]. Care link transport arranged for pickup. Pt remains stable at this time.

## 2019-01-16 NOTE — Discharge Instructions (Addendum)
Person Under Monitoring Name: Rebecca Weeks  Location: [address] [place] [address]   Infection Prevention Recommendations for Individuals Confirmed to have, or Being Evaluated for, 2019 Novel Coronavirus (COVID-19) Infection Who Receive Care at Home  Individuals who are confirmed to have, or are being evaluated for, COVID-19 should follow the prevention steps below until a healthcare provider or local or state health department says they can return to normal activities.  Stay home except to get medical care You should restrict activities outside your home, except for getting medical care. Do not go to work, school, or public areas, and do not use public transportation or taxis.  Call ahead before visiting your doctor Before your medical appointment, call the healthcare provider and tell them that you have, or are being evaluated for, COVID-19 infection. This will help the healthcare providers office take steps to keep other people from getting infected. Ask your healthcare provider to call the local or state health department.  Monitor your symptoms Seek prompt medical attention if your illness is worsening (e.g., difficulty breathing). Before going to your medical appointment, call the healthcare provider and tell them that you have, or are being evaluated for, COVID-19 infection. Ask your healthcare provider to call the local or state health department.  Wear a facemask You should wear a facemask that covers your nose and mouth when you are in the same room with other people and when you visit a healthcare provider. People who live with or visit you should also wear a facemask while they are in the same room with you.  Separate yourself from other people in your home As much as possible, you should stay in a different room from other people in your home. Also, you should use a separate bathroom, if available.  Avoid sharing household items You should not share  dishes, drinking glasses, cups, eating utensils, towels, bedding, or other items with other people in your home. After using these items, you should wash them thoroughly with soap and water.  Cover your coughs and sneezes Cover your mouth and nose with a tissue when you cough or sneeze, or you can cough or sneeze into your sleeve. Throw used tissues in a lined trash can, and immediately wash your hands with soap and water for at least 20 seconds or use an alcohol-based hand rub.  Wash your [employer] your hands often and thoroughly with soap and water for at least 20 seconds. You can use an alcohol-based hand sanitizer if soap and water are not available and if your hands are not visibly dirty. Avoid touching your eyes, nose, and mouth with unwashed hands.   Prevention Steps for Caregivers and Household Members of Individuals Confirmed to have, or Being Evaluated for, COVID-19 Infection Being Cared for in the Home  If you live with, or provide care at home for, a person confirmed to have, or being evaluated for, COVID-19 infection please follow these guidelines to prevent infection:  Follow healthcare providers instructions Make sure that you understand and can help the patient follow any healthcare provider instructions for all care.  Provide for the patients basic needs You should help the patient with basic needs in the home and provide support for getting groceries, prescriptions, and other personal needs.  Monitor the patients symptoms If they are getting sicker, call his or her medical provider and tell them that the patient has, or is being evaluated for, COVID-19 infection. This will help the healthcare providers office take  steps to keep other people from getting infected. Ask the healthcare provider to call the local or state health department.  Limit the number of people who have contact with the patient  If possible, have only one caregiver for the patient.  Other  household members should stay in another home or place of residence. If this is not possible, they should stay  in another room, or Henrine separated from the patient as much as possible. Use a separate bathroom, if available.  Restrict visitors who do not have an essential need to Angell in the home.  Keep older adults, very young children, and other sick people away from the patient Keep older adults, very young children, and those who have compromised immune systems or chronic health conditions away from the patient. This includes people with chronic heart, lung, or kidney conditions, diabetes, and cancer.  Ensure good ventilation Make sure that shared spaces in the home have good air flow, such as from an air conditioner or an opened window, weather permitting.  Wash your hands often  Wash your hands often and thoroughly with soap and water for at least 20 seconds. You can use an alcohol based hand sanitizer if soap and water are not available and if your hands are not visibly dirty.  Avoid touching your eyes, nose, and mouth with unwashed hands.  Use disposable paper towels to dry your hands. If not available, use dedicated cloth towels and replace them when they become wet.  Wear a facemask and gloves  Wear a disposable facemask at all times in the room and gloves when you touch or have contact with the patients blood, body fluids, and/or secretions or excretions, such as sweat, saliva, sputum, nasal mucus, vomit, urine, or feces.  Ensure the mask fits over your nose and mouth tightly, and do not touch it during use.  Throw out disposable facemasks and gloves after using them. Do not reuse.  Wash your hands immediately after removing your facemask and gloves.  If your personal clothing becomes contaminated, carefully remove clothing and launder. Wash your hands after handling contaminated clothing.  Place all used disposable facemasks, gloves, and other waste in a lined container before  disposing them with other household waste.  Remove gloves and wash your hands immediately after handling these items.  Do not share dishes, glasses, or other household items with the patient  Avoid sharing household items. You should not share dishes, drinking glasses, cups, eating utensils, towels, bedding, or other items with a patient who is confirmed to have, or being evaluated for, COVID-19 infection.  After the person uses these items, you should wash them thoroughly with soap and water.  Wash laundry thoroughly  Immediately remove and wash clothes or bedding that have blood, body fluids, and/or secretions or excretions, such as sweat, saliva, sputum, nasal mucus, vomit, urine, or feces, on them.  Wear gloves when handling laundry from the patient.  Read and follow directions on labels of laundry or clothing items and detergent. In general, wash and dry with the warmest temperatures recommended on the label.  Clean all areas the individual has used often  Clean all touchable surfaces, such as counters, tabletops, doorknobs, bathroom fixtures, toilets, phones, keyboards, tablets, and bedside tables, every day. Also, clean any surfaces that may have blood, body fluids, and/or secretions or excretions on them.  Wear gloves when cleaning surfaces the patient has come in contact with.  Use a diluted bleach solution (e.g., dilute bleach with 1 part bleach  and 10 parts water) or a household disinfectant with a label that says EPA-registered for coronaviruses. To make a bleach solution at home, add 1 tablespoon of bleach to 1 quart (4 cups) of water. For a larger supply, add  cup of bleach to 1 gallon (16 cups) of water.  Read labels of cleaning products and follow recommendations provided on product labels. Labels contain instructions for safe and effective use of the cleaning product including precautions you should take when applying the product, such as wearing gloves or eye protection  and making sure you have good ventilation during use of the product.  Remove gloves and wash hands immediately after cleaning.  Monitor yourself for signs and symptoms of illness Caregivers and household members are considered close contacts, should monitor their health, and will Aluel asked to limit movement outside of the home to the extent possible. Follow the monitoring steps for close contacts listed on the symptom monitoring form.   ? If you have additional questions, contact your local health department or call the epidemiologist on call at [phone] (available 24/7). ? This guidance is subject to change. For the most up-to-date guidance from [facility], please refer to their website: [email or web address]

## 2019-01-16 NOTE — ED Notes (Signed)
Inpatient provider at bedside. Pt in NAD

## 2019-01-16 NOTE — Progress Notes (Signed)
CRITICAL VALUE ALERT  Critical Value:  SARS Coronavirus 2 posistive  Date & Time Notied:  01/16/2019  Provider Notified: Dr. Julian Reil   Orders Received/Actions taken: None

## 2019-01-16 NOTE — ED Notes (Signed)
ED TO INPATIENT HANDOFF REPORT  ED Nurse Name and Phone #: Shianna Bally RN  S Name/Age/Gender Rebecca Weeks 78 y.o. female Room/Bed: WA12/WA12  Code Status   Code Status: Full Code  Home/SNF/Other Home Patient oriented to: self, place, time and situation Is this baseline? Yes   Triage Complete: Triage complete  Chief Complaint Chest pain  Triage Note Pt reports with fever and right leg pain for 3 days. Husband went to [place] yesterday with COVID. Pt denies CP, SOB and cough   Allergies No Known Allergies  Level of Care/Admitting Diagnosis ED Disposition    ED Disposition Condition Comment   Admit  [facility] [[id number]]  Level of Care: Med-Surg [16]  Covid Evaluation: Person Under Investigation (PUI)  Isolation Risk Level: Low Risk/Droplet (Less than 4L Brandon supplementation)  Diagnosis: Suspected Covid-19 Virus Infection [[id number]]  Admitting Physician: Hillary Bow [phone]  Attending Physician: Hillary Bow [[id number]  PT Class (Do Not Modify): Observation [104]  PT Acc Code (Do Not Modify): Observation [10022]       B Medical/Surgery History Past Medical History:  Diagnosis Date  . HTN (hypertension)    History reviewed. No pertinent surgical history.   A IV Location/Drains/Wounds Patient Lines/Drains/Airways Status   Active Line/Drains/Airways    Name:   Placement date:   Placement time:   Site:   Days:   Peripheral IV 01/16/19 Right Hand   01/16/19    0050    Hand   less than 1          Intake/Output Last 24 hours No intake or output data in the 24 hours ending 01/16/19 0254  Labs/Imaging Results for orders placed or performed during the hospital encounter of 01/15/19 (from the past 48 hour(s))  CBC     Status: Abnormal   Collection Time: 01/15/19  9:04 PM  Result Value Ref Range   WBC 6.0 4.0 - 10.5 K/uL   RBC 4.78 3.87 - 5.11 MIL/uL   Hemoglobin 10.9 (L) 12.0 - 15.0 g/dL   HCT 19.1 (L) 47.8 - 29.5 %   MCV  65.7 (L) 80.0 - 100.0 fL   MCH 22.8 (L) 26.0 - 34.0 pg   MCHC 34.7 30.0 - 36.0 g/dL   RDW 62.1 30.8 - 65.7 %   Platelets 170 150 - 400 K/uL   nRBC 0.0 0.0 - 0.2 %    Comment: Performed at [facility], [address], [place], [place] [address]  Comprehensive metabolic panel     Status: Abnormal   Collection Time: 01/15/19  9:04 PM  Result Value Ref Range   Sodium 137 135 - 145 mmol/L   Potassium 2.9 (L) 3.5 - 5.1 mmol/L   Chloride 105 98 - 111 mmol/L   CO2 21 (L) 22 - 32 mmol/L   Glucose, Bld 122 (H) 70 - 99 mg/dL   BUN 16 8 - 23 mg/dL   Creatinine, Ser 2.95 0.44 - 1.00 mg/dL   Calcium 8.3 (L) 8.9 - 10.3 mg/dL   Total Protein 6.8 6.5 - 8.1 g/dL   Albumin 3.4 (L) 3.5 - 5.0 g/dL   AST 36 15 - 41 U/L   ALT 25 0 - 44 U/L   Alkaline Phosphatase 56 38 - 126 U/L   Total Bilirubin 0.5 0.3 - 1.2 mg/dL   GFR calc non Af Amer 59 (L) >60 mL/min   GFR calc Af Amer >60 >60 mL/min   Anion gap 11 5 -  15    Comment: Performed at [facility] [place] [facility], [address] [place], [place] [address]  Urinalysis, Routine w reflex microscopic     Status: Abnormal   Collection Time: 01/15/19  9:04 PM  Result Value Ref Range   Color, Urine YELLOW YELLOW   APPearance CLEAR CLEAR   Specific Gravity, Urine 1.011 1.005 - 1.030   pH 6.0 5.0 - 8.0   Glucose, UA NEGATIVE NEGATIVE mg/dL   Hgb urine dipstick NEGATIVE NEGATIVE   Bilirubin Urine NEGATIVE NEGATIVE   Ketones, ur 5 (A) NEGATIVE mg/dL   Protein, ur NEGATIVE NEGATIVE mg/dL   Nitrite NEGATIVE NEGATIVE   Leukocytes,Ua NEGATIVE NEGATIVE    Comment: Performed at [facility], [address] [place], [place] [address]  SARS Coronavirus 2 (CEPHEID- Performed in [facility] hospital lab), Hosp Order     Status: None   Collection Time: 01/15/19  9:04 PM  Result Value Ref Range   SARS Coronavirus 2 NEGATIVE NEGATIVE    Comment: (NOTE) If result is NEGATIVE SARS-CoV-2 target nucleic acids are NOT  DETECTED. The SARS-CoV-2 RNA is generally detectable in upper and lower  respiratory specimens during the acute phase of infection. The lowest  concentration of SARS-CoV-2 viral copies this assay can detect is 250  copies / mL. A negative result does not preclude SARS-CoV-2 infection  and should not Sharnette used as the sole basis for treatment or other  patient management decisions.  A negative result may occur with  improper specimen collection / handling, submission of specimen other  than nasopharyngeal swab, presence of viral mutation(s) within the  areas targeted by this assay, and inadequate number of viral copies  (<250 copies / mL). A negative result must Mikela combined with clinical  observations, patient history, and epidemiological information. If result is POSITIVE SARS-CoV-2 target nucleic acids are DETECTED. The SARS-CoV-2 RNA is generally detectable in upper and lower  respiratory specimens dur ing the acute phase of infection.  Positive  results are indicative of active infection with SARS-CoV-2.  Clinical  correlation with patient history and other diagnostic information is  necessary to determine patient infection status.  Positive results do  not rule out bacterial infection or co-infection with other viruses. If result is PRESUMPTIVE POSTIVE SARS-CoV-2 nucleic acids MAY Jaina PRESENT.   A presumptive positive result was obtained on the submitted specimen  and confirmed on repeat testing.  While 2019 novel coronavirus  (SARS-CoV-2) nucleic acids may Carely present in the submitted sample  additional confirmatory testing may Franny necessary for epidemiological  and / or clinical management purposes  to differentiate between  SARS-CoV-2 and other Sarbecovirus currently known to infect humans.  If clinically indicated additional testing with an alternate test  methodology [phone]([id number]) is advised. The SARS-CoV-2 RNA is generally  detectable in upper and lower respiratory sp ecimens during  the acute  phase of infection. The expected result is Negative. Fact Sheet for Patients:  BoilerBrush.com.cyhttps://www.fda.gov/media/136312/download Fact Sheet for Healthcare Providers: https://pope.com/https://www.fda.gov/media/136313/download This test is not yet approved or cleared by the [place] FDA and has been authorized for detection and/or diagnosis of SARS-CoV-2 by FDA under an Emergency Use Authorization (EUA).  This EUA will remain in effect (meaning this test can Jossalin used) for the duration of the COVID-19 declaration under Section 564(b)(1) of the Act, 21 U.S.C. section 360bbb-3(b)(1), unless the authorization is terminated or revoked sooner. Performed at [facility], [address] [place], [place] [address]    Ct  Abdomen Pelvis Wo Contrast  Result Date: 01/15/2019 CLINICAL DATA:  Fever and right lower quadrant pain. EXAM: CT ABDOMEN AND PELVIS WITHOUT CONTRAST TECHNIQUE: Multidetector CT imaging of the abdomen and pelvis was performed following the standard protocol without IV contrast. COMPARISON:  None. FINDINGS: Lower chest: Peripheral ground-glass opacities are seen in the lower lobes bilaterally and in the periphery of the lingula and right middle lobe. No pleural effusion. There is cardiomegaly. Hepatobiliary: No focal liver abnormality is seen. No gallstones, gallbladder wall thickening, or biliary dilatation. Pancreas: Unremarkable. No pancreatic ductal dilatation or surrounding inflammatory changes. Spleen: Normal in size without focal abnormality. Adrenals/Urinary Tract: Adrenal glands are unremarkable. Kidneys are normal, without renal calculi, focal lesion, or hydronephrosis. Bladder is unremarkable. Stomach/Bowel: Stomach is within normal limits. Appendix appears normal. No evidence of bowel wall thickening, distention, or inflammatory changes. Vascular/Lymphatic: Aortic atherosclerosis. No enlarged abdominal or pelvic lymph nodes. Reproductive: A cystic lesion left adnexa  measures 3.8 cm AP x 5.3 cm transverse x 3.6 cm craniocaudal. Uterus and right ovary are unremarkable. Other: None. Musculoskeletal: No acute or focal abnormality. IMPRESSION: There are a spectrum of findings in the lungs which can Karalyn seen with acute atypical infection (as well as other non-infectious etiologies). In particular, viral pneumonia (including COVID-19) should Berthe considered in the appropriate clinical setting. Review of the patient's chart shows she is currently being tested for COVID-19. No acute abnormality abdomen or pelvis. Cystic lesion left adnexa cannot Ilda definitively characterized. Nonemergent pelvic ultrasound is recommended for characterization. This recommendation follows ACR consensus guidelines: White Paper of the ACR Incidental Findings Committee II on Adnexal Findings. J Am Coll Radiol [phone]. Electronically Signed   By: Drusilla Kanner M.D.   On: 01/15/2019 23:13   Dg Chest Port 1 View  Result Date: 01/15/2019 CLINICAL DATA:  Fever EXAM: PORTABLE CHEST 1 VIEW COMPARISON:  02/07/2008 FINDINGS: The cardiac silhouette is significantly enlarged. There are hazy bilateral airspace opacities, for example at the left lung base and in the peripheral mid lung zones bilaterally. There is no pneumothorax. No large pleural effusion. IMPRESSION: 1. Hazy bilateral airspace opacities as detailed above, highly concerning for an atypical (viral) pneumonia. 2. Cardiomegaly. Electronically Signed   By: Katherine Mantle M.D.   On: 01/15/2019 21:28    Pending Labs Unresulted Labs (From admission, onward)    Start     Ordered   01/17/19 0500  CBC with Differential/Platelet  Daily,   R     01/16/19 0142   01/17/19 0500  Comprehensive metabolic panel  Daily,   R     01/16/19 0142   01/17/19 0500  C-reactive protein  Daily,   R     01/16/19 0142   01/16/19 0142  SARS Coronavirus 2 [facility] order, Performed in [facility] hospital lab)  (Novel Coronavirus, [facility] Order))  ONCE -  STAT,   R     01/16/19 0142   01/16/19 0142  D-dimer, quantitative (not at [facility])  Once,   R     01/16/19 0142   01/16/19 0142  C-reactive protein  Once,   R     01/16/19 0142   01/16/19 0140  ABO/Rh  Once,   R     01/16/19 0142          Vitals/Pain Today's Vitals   01/15/19 2346 01/16/19 0047 01/16/19 0213 01/16/19 0230  BP:  127/70  (!) 106/53  Pulse:  83  69  Resp:  19  18  Temp:  98.4  F (36.9 C)  98.5 F (36.9 C)  TempSrc:  Oral  Oral  SpO2:  95%  94%  Weight:      Height:      PainSc: 2   0-No pain     Isolation Precautions Airborne and Contact precautions  Medications Medications  enoxaparin (LOVENOX) injection 40 mg (has no administration in time range)  guaiFENesin-dextromethorphan (ROBITUSSIN DM) 100-10 MG/5ML syrup 10 mL (has no administration in time range)  chlorpheniramine-HYDROcodone (TUSSIONEX) 10-8 MG/5ML suspension 5 mL (has no administration in time range)  acetaminophen (TYLENOL) tablet 650 mg (has no administration in time range)  ondansetron (ZOFRAN) tablet 4 mg (has no administration in time range)    Or  ondansetron (ZOFRAN) injection 4 mg (has no administration in time range)  0.9 %  sodium chloride infusion (has no administration in time range)  potassium chloride 10 mEq in 100 mL IVPB (has no administration in time range)  oxyCODONE (Oxy IR/ROXICODONE) immediate release tablet 5 mg (5 mg Oral Given 01/15/19 2215)  sodium chloride 0.9 % bolus 1,000 mL (0 mLs Intravenous Stopped 01/16/19 0247)  ondansetron (ZOFRAN) injection 4 mg (4 mg Intravenous Given 01/16/19 0247)    Mobility walks Low fall risk   Focused Assessments Pulmonary Assessment Handoff:  Lung sounds:   O2 Device: Room Air        R Recommendations: See Admitting Provider Note  Report given to:   Additional Notes:

## 2019-01-16 NOTE — Progress Notes (Signed)
Unable to complete admission history due to language barrier and the stratus interpreter does not have her dialect(Ede)-Rhade. Unable to call friend/family that was used in the emergency room because of time.

## 2019-01-17 DIAGNOSIS — R509 Fever, unspecified: Secondary | ICD-10-CM | POA: Diagnosis not present

## 2019-01-17 DIAGNOSIS — I10 Essential (primary) hypertension: Secondary | ICD-10-CM | POA: Diagnosis not present

## 2019-01-17 DIAGNOSIS — R6889 Other general symptoms and signs: Secondary | ICD-10-CM | POA: Diagnosis not present

## 2019-01-17 DIAGNOSIS — U071 COVID-19: Secondary | ICD-10-CM | POA: Diagnosis not present

## 2019-01-17 LAB — CBC WITH DIFFERENTIAL/PLATELET
Abs Immature Granulocytes: 0.06 10*3/uL (ref 0.00–0.07)
Basophils Absolute: 0 10*3/uL (ref 0.0–0.1)
Basophils Relative: 0 %
Eosinophils Absolute: 0 10*3/uL (ref 0.0–0.5)
Eosinophils Relative: 0 %
HCT: 28.1 % — ABNORMAL LOW (ref 36.0–46.0)
Hemoglobin: 9.4 g/dL — ABNORMAL LOW (ref 12.0–15.0)
Immature Granulocytes: 1 %
Lymphocytes Relative: 18 %
Lymphs Abs: 1.2 10*3/uL (ref 0.7–4.0)
MCH: 22.1 pg — ABNORMAL LOW (ref 26.0–34.0)
MCHC: 33.5 g/dL (ref 30.0–36.0)
MCV: 66 fL — ABNORMAL LOW (ref 80.0–100.0)
Monocytes Absolute: 0.2 10*3/uL (ref 0.1–1.0)
Monocytes Relative: 3 %
Neutro Abs: 5.3 10*3/uL (ref 1.7–7.7)
Neutrophils Relative %: 78 %
Platelets: 180 10*3/uL (ref 150–400)
RBC: 4.26 MIL/uL (ref 3.87–5.11)
RDW: 15.1 % (ref 11.5–15.5)
WBC: 6.8 10*3/uL (ref 4.0–10.5)
nRBC: 0 % (ref 0.0–0.2)

## 2019-01-17 LAB — COMPREHENSIVE METABOLIC PANEL
ALT: 18 U/L (ref 0–44)
AST: 30 U/L (ref 15–41)
Albumin: 2.7 g/dL — ABNORMAL LOW (ref 3.5–5.0)
Alkaline Phosphatase: 48 U/L (ref 38–126)
Anion gap: 7 (ref 5–15)
BUN: 9 mg/dL (ref 8–23)
CO2: 21 mmol/L — ABNORMAL LOW (ref 22–32)
Calcium: 7.8 mg/dL — ABNORMAL LOW (ref 8.9–10.3)
Chloride: 113 mmol/L — ABNORMAL HIGH (ref 98–111)
Creatinine, Ser: 0.76 mg/dL (ref 0.44–1.00)
GFR calc Af Amer: >60 mL/min (ref >60)
GFR calc non Af Amer: >60 mL/min (ref >60)
Glucose, Bld: 97 mg/dL (ref 70–99)
Potassium: 4 mmol/L (ref 3.5–5.1)
Sodium: 141 mmol/L (ref 135–145)
Total Bilirubin: 0.8 mg/dL (ref 0.3–1.2)
Total Protein: 5.8 g/dL — ABNORMAL LOW (ref 6.5–8.1)

## 2019-01-17 LAB — D-DIMER, QUANTITATIVE: D-Dimer, Quant: 0.77 ug/mL-FEU — ABNORMAL HIGH (ref 0.00–0.50)

## 2019-01-17 LAB — C-REACTIVE PROTEIN: CRP: 10.8 mg/dL — ABNORMAL HIGH (ref <1.0)

## 2019-01-17 LAB — FERRITIN: Ferritin: 876 ng/mL — ABNORMAL HIGH (ref 11–307)

## 2019-01-17 LAB — PROCALCITONIN: Procalcitonin: 0.51 ng/mL

## 2019-01-17 NOTE — TOC Initial Note (Signed)
Transition of Care [facility]) - Initial/Assessment Note    Patient Details  Name: Regnia Savastano MRN: [id number] Date of Birth: 02-May-1941  [facility]) CM/SW Contact:    Colleen Can RN, BSN, NCM-BC, ACM-RN [phone] Phone Number: 01/17/2019, 2:40 PM  Clinical Narrative:                 78 yo female presented with fever, malaise; patients spouse has confirmed COVID-19. Patient lives at home with her spouse; speaks Public relations account executive. Patient has Medicare/Medicaid [place] with no PCP listed. CM arranged a televisit hospital f/u appointment with: [facility] on 01/24/19 @ 0850; AVS updated. Patient continues being acutely ill with a fever, poor appetite. CM team will continue to follow for dispositional needs.   Expected Discharge Plan: Home/Self Care Barriers to Discharge: Continued Medical Work up   Patient Goals and CMS Choice     Choice offered to / list presented to : NA  Expected Discharge Plan and Services Expected Discharge Plan: Home/Self Care In-house Referral: Interpreting Services Discharge Planning Services: CM Consult, Follow-up appt scheduled, [facility] Post Acute Care Choice: NA Living arrangements for the past 2 months: Single Family Home  Prior Living Arrangements/Services Living arrangements for the past 2 months: Single Family Home Lives with:: Self, Spouse Patient language and need for interpreter reviewed:: Yes  Activities of Daily Living Home Assistive Devices/Equipment: None ADL Screening (condition at time of admission) Patient's cognitive ability adequate to safely complete daily activities?: Yes Is the patient deaf or have difficulty hearing?: No Does the patient have difficulty seeing, even when wearing glasses/contacts?: No Does the patient have difficulty concentrating, remembering, or making decisions?: No Patient able to express need for assistance with ADLs?: No Does the patient have difficulty dressing or bathing?: No Independently performs  ADLs?: Yes (appropriate for developmental age) Does the patient have difficulty walking or climbing stairs?: No Weakness of Legs: None Weakness of Arms/Hands: None   Admission diagnosis:  Fever [R50.9] Viral pneumonia [J12.9] Patient Active Problem List   Diagnosis Date Noted  . Suspected Covid-19 Virus Infection 01/16/2019  . Suspected 2019 Novel Coronavirus Infection 01/16/2019  . Other and unspecified hyperlipidemia 05/09/2013  . Back pain 05/09/2013  . Unspecified vitamin D deficiency 05/09/2013  . Essential hypertension, benign 05/09/2013   PCP:  Patient, [name] Pharmacy:   [id number] [place], [place] - [address] AT [facility] & [address] [place] [address] Phone: [phone] Fax: [phone]     Social Determinants of Health (SDOH) Interventions    Readmission Risk Interventions No flowsheet data found.

## 2019-01-17 NOTE — Progress Notes (Signed)
Pt temp 102.0, MD notified. Tylenol given. Will continue to monitor

## 2019-01-17 NOTE — Progress Notes (Signed)
PROGRESS NOTE                                                                                                                                                                                                             Patient Demographics:    Rebecca Weeks, is a 78 y.o. female, DOB - 07/18/1941, ZOX:[id number]  Admit date - 01/15/2019   Admitting Physician Hillary BowJared M Gardner, DO  Outpatient Primary MD for the patient is Patient, [name]  LOS - 1   Chief Complaint  Patient presents with   Fever       Brief Narrative    Rebecca Weeks is a 78 y.o. female with medical history significant of HTN, though her BP looks okay today and she doesn't take any of her BP meds at baseline it seems.  Patient presents to the ED with 2-3 day history of fever, malaise.  Denies cough or SOB.  Does have nausea, vomiting, and lightheadedness.    Subjective:    Rebecca Weeks today she has significant fevers on 0107, 2 this afternoon she denies nausea or vomiting, but she has a poor appetite, did not eat anything of her breakfast.   Assessment  & Plan :    Principal Problem:   Suspected Covid-19 Virus Infection Active Problems:   Essential hypertension, benign   Suspected 2019 Novel Coronavirus Infection   Nausea and vomiting -Has resolved, this is mostly in the setting of her COVID-19 infection, but she remains with very poor appetite, did not have any oral intake earlier today, I will continue with IV fluids, she has been 100 cc/h, decreased to 75 cc as I would like her to Bettejane more on the right side in the setting of help at night.  COVID 19 infection -Remains on room air, denies any dyspnea, markers trending up including CRP and ferritin, will continue to trend daily as she is high risk for decompensation given her age. -Remains with significant fever, continue with Tylenol PRN  Hypertension - blood pressure remains lower side, will continue to hold  Norvasc  Hyperlipedemia - cont with statin  COVID-19 Labs  Recent Labs    01/16/19 0142 01/17/19 0323  DDIMER 0.64* 0.77*  FERRITIN  --  876*  CRP 5.1* 10.8*    Lab Results  Component Value Date   SARSCOV2NAA POSITIVE (  A) 01/16/2019   SARSCOV2NAA NEGATIVE 01/15/2019     Code Status : Full  Family Communication  : D/W patient and her husband via interpreter  Disposition Plan  : home  Barriers For Discharge : remains with significant fever, poor oral intake, on IV fluids  Consults  :  none  Procedures  : None  DVT Prophylaxis  :  [place] lovenox  Lab Results  Component Value Date   PLT 180 01/17/2019    Antibiotics  :    Anti-infectives (From admission, onward)   None        Objective:   Vitals:   01/17/19 0812 01/17/19 0900 01/17/19 1200 01/17/19 1209  BP:  108/84 112/66   Pulse:  62 78   Resp:  20 (!) 21   Temp: 98.4 F (36.9 C)   (!) 102 F (38.9 C)  TempSrc: Oral   Oral  SpO2:  100% 93%   Weight:      Height:        Wt Readings from Last 3 Encounters:  01/15/19 51 kg  04/18/13 49.4 kg     Intake/Output Summary (Last 24 hours) at 01/17/2019 1411 Last data filed at 01/16/2019 1500 Gross per 24 hour  Intake 756.7 ml  Output --  Net 756.7 ml     Physical Exam  Awake Alert, Oriented X 3, No new F.N deficits, Normal affect Symmetrical Chest wall movement, Good air movement bilaterally, CTAB RRR,No Gallops,Rubs or new Murmurs, No Parasternal Heave +ve B.Sounds, Abd Soft, No tenderness,  No rebound - guarding or rigidity. No Cyanosis, Clubbing or edema, No new Rash or bruise      Data Review:    CBC Recent Labs  Lab 01/15/19 2104 01/17/19 0323  WBC 6.0 6.8  HGB 10.9* 9.4*  HCT 31.4* 28.1*  PLT 170 180  MCV 65.7* 66.0*  MCH 22.8* 22.1*  MCHC 34.7 33.5  RDW 15.0 15.1  LYMPHSABS  --  1.2  MONOABS  --  0.2  EOSABS  --  0.0  BASOSABS  --  0.0    Chemistries  Recent Labs  Lab 01/15/19 2104 01/17/19 0323  NA 137 141  K  2.9* 4.0  CL 105 113*  CO2 21* 21*  GLUCOSE 122* 97  BUN 16 9  CREATININE 0.93 0.76  CALCIUM 8.3* 7.8*  AST 36 30  ALT 25 18  ALKPHOS 56 48  BILITOT 0.5 0.8   ------------------------------------------------------------------------------------------------------------------ No results for input(s): CHOL, HDL, LDLCALC, TRIG, CHOLHDL, LDLDIRECT in the last 72 hours.  No results found for: HGBA1C ------------------------------------------------------------------------------------------------------------------ No results for input(s): TSH, T4TOTAL, T3FREE, THYROIDAB in the last 72 hours.  Invalid input(s): FREET3 ------------------------------------------------------------------------------------------------------------------ Recent Labs    01/17/19 0323  FERRITIN 876*    Coagulation profile No results for input(s): INR, PROTIME in the last 168 hours.  Recent Labs    01/16/19 0142 01/17/19 0323  DDIMER 0.64* 0.77*    Cardiac Enzymes No results for input(s): CKMB, TROPONINI, MYOGLOBIN in the last 168 hours.  Invalid input(s): CK ------------------------------------------------------------------------------------------------------------------ No results found for: BNP  Inpatient Medications  Scheduled Meds:  enoxaparin (LOVENOX) injection  40 mg Subcutaneous Daily   Continuous Infusions:  sodium chloride 100 mL/hr at 01/16/19 1319   PRN Meds:.acetaminophen, chlorpheniramine-HYDROcodone, guaiFENesin-dextromethorphan, ondansetron **OR** ondansetron (ZOFRAN) IV  Micro Results Recent Results (from the past 240 hour(s))  SARS Coronavirus 2 (CEPHEID- Performed in [facility] hospital lab), Hosp Order     Status: None   Collection Time: 01/15/19  9:04 PM  Result Value Ref Range Status   SARS Coronavirus 2 NEGATIVE NEGATIVE Final    Comment: (NOTE) If result is NEGATIVE SARS-CoV-2 target nucleic acids are NOT DETECTED. The SARS-CoV-2 RNA is generally detectable in  upper and lower  respiratory specimens during the acute phase of infection. The lowest  concentration of SARS-CoV-2 viral copies this assay can detect is 250  copies / mL. A negative result does not preclude SARS-CoV-2 infection  and should not Dhrithi used as the sole basis for treatment or other  patient management decisions.  A negative result may occur with  improper specimen collection / handling, submission of specimen other  than nasopharyngeal swab, presence of viral mutation(s) within the  areas targeted by this assay, and inadequate number of viral copies  (<250 copies / mL). A negative result must Una combined with clinical  observations, patient history, and epidemiological information. If result is POSITIVE SARS-CoV-2 target nucleic acids are DETECTED. The SARS-CoV-2 RNA is generally detectable in upper and lower  respiratory specimens dur ing the acute phase of infection.  Positive  results are indicative of active infection with SARS-CoV-2.  Clinical  correlation with patient history and other diagnostic information is  necessary to determine patient infection status.  Positive results do  not rule out bacterial infection or co-infection with other viruses. If result is PRESUMPTIVE POSTIVE SARS-CoV-2 nucleic acids MAY Diamonds PRESENT.   A presumptive positive result was obtained on the submitted specimen  and confirmed on repeat testing.  While 2019 novel coronavirus  (SARS-CoV-2) nucleic acids may Orlanda present in the submitted sample  additional confirmatory testing may Kamarri necessary for epidemiological  and / or clinical management purposes  to differentiate between  SARS-CoV-2 and other Sarbecovirus currently known to infect humans.  If clinically indicated additional testing with an alternate test  methodology [phone]) is advised. The SARS-CoV-2 RNA is generally  detectable in upper and lower respiratory sp ecimens during the acute  phase of infection. The expected result is  Negative. Fact Sheet for Patients:  BoilerBrush.com.cy Fact Sheet for Healthcare Providers: https://pope.com/ This test is not yet approved or cleared by the [place] FDA and has been authorized for detection and/or diagnosis of SARS-CoV-2 by FDA under an Emergency Use Authorization (EUA).  This EUA will remain in effect (meaning this test can Shalene used) for the duration of the COVID-19 declaration under Section 564(b)(1) of the Act, 21 U.S.C. section 360bbb-3(b)(1), unless the authorization is terminated or revoked sooner. Performed at [facility], [address], [place], [place] [address]   SARS Coronavirus 2 [facility] order, Performed in [facility] hospital lab)     Status: Abnormal   Collection Time: 01/16/19  4:00 AM  Result Value Ref Range Status   SARS Coronavirus 2 POSITIVE (A) NEGATIVE Final    Comment: CRITICAL RESULT CALLED TO, READ BACK BY AND VERIFIED WITH: RN [facility] AT 5188 01/16/19 CRUICKSHANK A (NOTE) If result is NEGATIVE SARS-CoV-2 target nucleic acids are NOT DETECTED. The SARS-CoV-2 RNA is generally detectable in upper and lower  respiratory specimens during the acute phase of infection. The lowest  concentration of SARS-CoV-2 viral copies this assay can detect is 250  copies / mL. A negative result does not preclude SARS-CoV-2 infection  and should not Elvira used as the sole basis for treatment or other  patient management decisions.  A negative result may occur with  improper specimen collection / handling, submission of specimen other  than nasopharyngeal swab,  presence of viral mutation(s) within the  areas targeted by this assay, and inadequate number of viral copies  (<250 copies / mL). A negative result must Arlee combined with clinical  observations, patient history, and epidemiological information. If result is POSITIVE SARS-CoV-2 target nucleic acids  are DETECTED. The SARS-CoV-2  RNA is generally detectable in upper and lower  respiratory specimens during the acute phase of infection.  Positive  results are indicative of active infection with SARS-CoV-2.  Clinical  correlation with patient history and other diagnostic information is  necessary to determine patient infection status.  Positive results do  not rule out bacterial infection or co-infection with other viruses. If result is PRESUMPTIVE POSTIVE SARS-CoV-2 nucleic acids MAY Junior PRESENT.   A presumptive positive result was obtained on the submitted specimen  and confirmed on repeat testing.  While 2019 novel coronavirus  (SARS-CoV-2) nucleic acids may Sincerity present in the submitted sample  additional confirmatory testing may Noble necessary for epidemiological  and / or clinical management purposes  to differentiate between  SARS-CoV-2 and other Sarbecovirus currently known to infect humans.  If clinically indicated additional testing with an alternate test  [facility] logy [phone]) is advised. The SARS-CoV-2 RNA is generally  detectable in upper and lower respiratory specimens during the acute  phase of infection. The expected result is Negative. Fact Sheet for Patients:  BoilerBrush.com.cy Fact Sheet for Healthcare Providers: https://pope.com/ This test is not yet approved or cleared by the [place] FDA and has been authorized for detection and/or diagnosis of SARS-CoV-2 by FDA under an Emergency Use Authorization (EUA).  This EUA will remain in effect (meaning this test can Sharnay used) for the duration of the COVID-19 declaration under Section 564(b)(1) of the Act, 21 U.S.C. section 360bbb-3(b)(1), unless the authorization is terminated or revoked sooner. Performed at [facility], [address] [place], [place] [address]     Radiology Reports Ct Abdomen Pelvis Wo Contrast  Result Date: 01/15/2019 CLINICAL DATA:  Fever and right lower  quadrant pain. EXAM: CT ABDOMEN AND PELVIS WITHOUT CONTRAST TECHNIQUE: Multidetector CT imaging of the abdomen and pelvis was performed following the standard protocol without IV contrast. COMPARISON:  None. FINDINGS: Lower chest: Peripheral ground-glass opacities are seen in the lower lobes bilaterally and in the periphery of the lingula and right middle lobe. No pleural effusion. There is cardiomegaly. Hepatobiliary: No focal liver abnormality is seen. No gallstones, gallbladder wall thickening, or biliary dilatation. Pancreas: Unremarkable. No pancreatic ductal dilatation or surrounding inflammatory changes. Spleen: Normal in size without focal abnormality. Adrenals/Urinary Tract: Adrenal glands are unremarkable. Kidneys are normal, without renal calculi, focal lesion, or hydronephrosis. Bladder is unremarkable. Stomach/Bowel: Stomach is within normal limits. Appendix appears normal. No evidence of bowel wall thickening, distention, or inflammatory changes. Vascular/Lymphatic: Aortic atherosclerosis. No enlarged abdominal or pelvic lymph nodes. Reproductive: A cystic lesion left adnexa measures 3.8 cm AP x 5.3 cm transverse x 3.6 cm craniocaudal. Uterus and right ovary are unremarkable. Other: None. Musculoskeletal: No acute or focal abnormality. IMPRESSION: There are a spectrum of findings in the lungs which can Donique seen with acute atypical infection (as well as other non-infectious etiologies). In particular, viral pneumonia (including COVID-19) should Keaundra considered in the appropriate clinical setting. Review of the patient's chart shows she is currently being tested for COVID-19. No acute abnormality abdomen or pelvis. Cystic lesion left adnexa cannot Fusaye definitively characterized. Nonemergent pelvic ultrasound is recommended for characterization. This recommendation follows ACR consensus guidelines: White Paper of  the ACR Incidental Findings Committee II on Adnexal Findings. J Am Coll Radiol [phone].  Electronically Signed   By: Drusilla Kanner M.D.   On: 01/15/2019 23:13   Dg Chest Port 1 View  Result Date: 01/15/2019 CLINICAL DATA:  Fever EXAM: PORTABLE CHEST 1 VIEW COMPARISON:  02/07/2008 FINDINGS: The cardiac silhouette is significantly enlarged. There are hazy bilateral airspace opacities, for example at the left lung base and in the peripheral mid lung zones bilaterally. There is no pneumothorax. No large pleural effusion. IMPRESSION: 1. Hazy bilateral airspace opacities as detailed above, highly concerning for an atypical (viral) pneumonia. 2. Cardiomegaly. Electronically Signed   By: Katherine Mantle M.D.   On: 01/15/2019 21:28    Time Spent in minutes  25 minutes   Huey Bienenstock M.D on 01/17/2019 at 2:11 PM  Between 7am to 7pm - Pager - [phone]  After 7pm go to [email or web address] - password [facility]  [facility] -  Office  [phone]

## 2019-01-18 DIAGNOSIS — I10 Essential (primary) hypertension: Secondary | ICD-10-CM | POA: Diagnosis not present

## 2019-01-18 DIAGNOSIS — R509 Fever, unspecified: Secondary | ICD-10-CM | POA: Diagnosis not present

## 2019-01-18 DIAGNOSIS — U071 COVID-19: Principal | ICD-10-CM

## 2019-01-18 DIAGNOSIS — J988 Other specified respiratory disorders: Secondary | ICD-10-CM

## 2019-01-18 LAB — CBC WITH DIFFERENTIAL/PLATELET
Abs Immature Granulocytes: 0.07 10*3/uL (ref 0.00–0.07)
Basophils Absolute: 0 10*3/uL (ref 0.0–0.1)
Basophils Relative: 0 %
Eosinophils Absolute: 0 10*3/uL (ref 0.0–0.5)
Eosinophils Relative: 0 %
HCT: 27.6 % — ABNORMAL LOW (ref 36.0–46.0)
Hemoglobin: 9.4 g/dL — ABNORMAL LOW (ref 12.0–15.0)
Immature Granulocytes: 1 %
Lymphocytes Relative: 13 %
Lymphs Abs: 1 10*3/uL (ref 0.7–4.0)
MCH: 22.2 pg — ABNORMAL LOW (ref 26.0–34.0)
MCHC: 34.1 g/dL (ref 30.0–36.0)
MCV: 65.2 fL — ABNORMAL LOW (ref 80.0–100.0)
Monocytes Absolute: 0.2 10*3/uL (ref 0.1–1.0)
Monocytes Relative: 2 %
Neutro Abs: 6.6 10*3/uL (ref 1.7–7.7)
Neutrophils Relative %: 84 %
Platelets: 205 10*3/uL (ref 150–400)
RBC: 4.23 MIL/uL (ref 3.87–5.11)
RDW: 15.1 % (ref 11.5–15.5)
WBC: 7.8 10*3/uL (ref 4.0–10.5)
nRBC: 0 % (ref 0.0–0.2)

## 2019-01-18 LAB — D-DIMER, QUANTITATIVE: D-Dimer, Quant: 1.01 ug/mL-FEU — ABNORMAL HIGH (ref 0.00–0.50)

## 2019-01-18 LAB — COMPREHENSIVE METABOLIC PANEL
ALT: 19 U/L (ref 0–44)
AST: 35 U/L (ref 15–41)
Albumin: 2.5 g/dL — ABNORMAL LOW (ref 3.5–5.0)
Alkaline Phosphatase: 58 U/L (ref 38–126)
Anion gap: 8 (ref 5–15)
BUN: 8 mg/dL (ref 8–23)
CO2: 22 mmol/L (ref 22–32)
Calcium: 7.9 mg/dL — ABNORMAL LOW (ref 8.9–10.3)
Chloride: 110 mmol/L (ref 98–111)
Creatinine, Ser: 0.77 mg/dL (ref 0.44–1.00)
GFR calc Af Amer: >60 mL/min (ref >60)
GFR calc non Af Amer: >60 mL/min (ref >60)
Glucose, Bld: 92 mg/dL (ref 70–99)
Potassium: 3.7 mmol/L (ref 3.5–5.1)
Sodium: 140 mmol/L (ref 135–145)
Total Bilirubin: 0.7 mg/dL (ref 0.3–1.2)
Total Protein: 5.7 g/dL — ABNORMAL LOW (ref 6.5–8.1)

## 2019-01-18 LAB — FERRITIN: Ferritin: 737 ng/mL — ABNORMAL HIGH (ref 11–307)

## 2019-01-18 LAB — PROCALCITONIN: Procalcitonin: 0.38 ng/mL

## 2019-01-18 LAB — C-REACTIVE PROTEIN: CRP: 14.9 mg/dL — ABNORMAL HIGH (ref <1.0)

## 2019-01-18 MED ORDER — ENSURE COMPLETE PO LIQD: 237.0000 mL | Freq: Three times a day (TID) | ORAL | Status: AC

## 2019-01-18 MED ORDER — ZINC SULFATE 220 (50 ZN) MG PO CAPS: 220.0000 mg | ORAL_CAPSULE | Freq: Every day | ORAL | 0 refills | Status: AC

## 2019-01-18 MED ORDER — VITAMIN C 250 MG PO TABS: 250.0000 mg | ORAL_TABLET | Freq: Every day | ORAL | Status: AC

## 2019-01-18 NOTE — Care Management (Signed)
Patient was assessed by previous case manager, has no needs at discharge. CM has arranged for [facility] to transport patient home. They should arrive at 3:00pm. Patient and husband will transport home at same time in separate ambulances. Medical Necessity form has b een completed and faxed to the unit. Secretary confirmed.     Vance Peper, RN BSN Case Manager [phone]

## 2019-01-18 NOTE — Progress Notes (Addendum)
wele [id number] interrupter utilized for assessment, education and medications  1300- [id number] interrupter utilized for Discharge teaching

## 2019-01-18 NOTE — Discharge Summary (Signed)
Rebecca Weeks, is a 78 y.o. female  DOB 1941-08-01  MRN [id number].  Admission date:  01/15/2019  Admitting Physician  Rebecca Bow, DO  Discharge Date:  01/18/2019   Primary MD  Patient, [name]  Recommendations for primary care physician for things to follow:  - please check CBC, BMP in 1 week   Admission Diagnosis  Fever [R50.9] Viral pneumonia [J12.9]   Discharge Diagnosis  Fever [R50.9] Viral pneumonia [J12.9]    Principal Problem:   Suspected Covid-19 Virus Infection Active Problems:   Essential hypertension, benign   Suspected 2019 Novel Coronavirus Infection   COVID-19      Past Medical History:  Diagnosis Date  . HTN (hypertension)     History reviewed. No pertinent surgical history.     History of present illness and  Hospital Course:     Kindly see H&P for history of present illness and admission details, please review complete Labs, Consult reports and Test reports for all details in brief  HPI  from the history and physical done on the day of admission 01/15/2019  HPI: Rebecca Weeks is a 78 y.o. female with medical history significant of HTN, though her BP looks okay today and she doesn't take any of her BP meds at baseline it seems.  Patient presents to the ED with 2-3 day history of fever, malaise.  Denies cough or SOB.  Does have nausea, vomiting, and lightheadedness.  Husband has confirmed COVID-19 and is currently admitted to [facility].   ED Course: CT abd/pelvis is significant for B PNA findings worrisome for COVID-19 (see report), otherwise negative.  COVID-19 test is surprisingly negative.  Patient has dizziness and vomiting when ambulating to bathroom.  History taken with help of translator over phone, patient speaks [facility] Course    Nausea and vomiting -Patient with nausea and vomiting, this has resolved, but she kept having very poor  appetite, she kept having intermittent fever, she was kept on IV fluids to keep her appropriately hydrated, appetite has improved today, he is having more fluid intake .  COVID 19 infection -Remains on room air, denies any dyspnea, her CRP trending up, but her ferritin is trending down, she denies any shortness of breath . -Continues to have intermittent fever, take Tylenol for fever .  Hypertension - blood pressure remains lower side, hold all meds  Hyperlipedemia - cont with statin    Discharge Condition:  Stable   Follow UP  Follow-up Information    [facility] Follow up on 01/24/2019.   Why:  at 8:50am for your televisit hospital follow-up appointment; Please Rebecca Weeks available for the call.  Contact information: [address] [place] [address] [phone]            Discharge Instructions  and  Discharge Medications     Discharge Instructions    Discharge instructions   Complete by:  As directed    Follow with [facility]   Activity: As  tolerated with Full fall precautions use walker/cane & assistance as needed   Disposition Home    Diet: Regular diet, take  Ensure daily and increase your fluid intake  For Heart failure patients - Check your Weight same time everyday, if you gain over 2 pounds, or you develop in leg swelling,   Please request your Prim.MD to go over all Hospital Tests and Procedure/Radiological results at the follow up, please get all Hospital records sent to your Prim MD by signing hospital release before you go home.   If you experience worsening of your admission symptoms, develop shortness of breath, life threatening emergency, suicidal or homicidal thoughts you must seek medical attention immediately by calling 911 or calling your MD immediately  if symptoms less severe.  You Must read complete instructions/literature along with all the possible adverse reactions/side  effects for all the Medicines you take and that have been prescribed to you. Take any new Medicines after you have completely understood and accpet all the possible adverse reactions/side effects.   Do not drive, operating heavy machinery, perform activities at heights, swimming or participation in water activities or provide baby sitting services if your were admitted for syncope or siezures until you have seen by Primary MD or a Neurologist and advised to do so again.  Do not drive when taking Pain medications.    Do not take more than prescribed Pain, Sleep and Anxiety Medications  Special Instructions: If you have smoked or chewed Tobacco  in the last 2 yrs please stop smoking, stop any regular Alcohol  and or any Recreational drug use.  Wear Seat belts while driving.   Please note  You were cared for by a hospitalist during your hospital stay. If you have any questions about your discharge medications or the care you received while you were in the hospital after you are discharged, you can call the unit and asked to speak with the hospitalist on call if the hospitalist that took care of you is not available. Once you are discharged, your primary care physician will handle any further medical issues. Please note that NO REFILLS for any discharge medications will Joleah authorized once you are discharged, as it is imperative that you return to your primary care physician (or establish a relationship with a primary care physician if you do not have one) for your aftercare needs so that they can reassess your need for medications and monitor your lab values.   Increase activity slowly   Complete by:  As directed      Allergies as of 01/18/2019   No Known Allergies     Medication List    STOP taking these medications   amLODipine 5 MG tablet Commonly known as:  NORVASC   meloxicam 15 MG tablet Commonly known as:  MOBIC   traMADol 50 MG tablet Commonly known as:  ULTRAM     TAKE these  medications   acetaminophen 650 MG CR tablet Commonly known as:  TYLENOL Take 650 mg by mouth every 8 (eight) hours as needed for pain.   cyclobenzaprine 5 MG tablet Commonly known as:  FLEXERIL Take 1 tablet (5 mg total) by mouth 2 (two) times daily as needed for muscle spasms (take one tab in AM and at bed time as needed).   feeding supplement (ENSURE COMPLETE) Liqd Take 237 mLs by mouth 3 (three) times daily with meals.   lovastatin 20 MG tablet Commonly known as:  MEVACOR Take 1 tablet (20 mg total)  by mouth at bedtime.   vitamin C 250 MG tablet Commonly known as:  ASCORBIC ACID Take 1 tablet (250 mg total) by mouth daily. Please take for 10 days   Vitamin D (Ergocalciferol) 1.25 MG (50000 UT) Caps capsule Commonly known as:  DRISDOL Take 1 capsule (50,000 Units total) by mouth every 7 (seven) days.   zinc sulfate 220 (50 Zn) MG capsule Commonly known as:  Zinc-220 Take 1 capsule (220 mg total) by mouth daily. Please take for 10 days         Diet and Activity recommendation: See Discharge Instructions above   Consults obtained -  None   Major procedures and Radiology Reports - PLEASE review detailed and final reports for all details, in brief -      Ct Abdomen Pelvis Wo Contrast  Result Date: 01/15/2019 CLINICAL DATA:  Fever and right lower quadrant pain. EXAM: CT ABDOMEN AND PELVIS WITHOUT CONTRAST TECHNIQUE: Multidetector CT imaging of the abdomen and pelvis was performed following the standard protocol without IV contrast. COMPARISON:  None. FINDINGS: Lower chest: Peripheral ground-glass opacities are seen in the lower lobes bilaterally and in the periphery of the lingula and right middle lobe. No pleural effusion. There is cardiomegaly. Hepatobiliary: No focal liver abnormality is seen. No gallstones, gallbladder wall thickening, or biliary dilatation. Pancreas: Unremarkable. No pancreatic ductal dilatation or surrounding inflammatory changes. Spleen: Normal in  size without focal abnormality. Adrenals/Urinary Tract: Adrenal glands are unremarkable. Kidneys are normal, without renal calculi, focal lesion, or hydronephrosis. Bladder is unremarkable. Stomach/Bowel: Stomach is within normal limits. Appendix appears normal. No evidence of bowel wall thickening, distention, or inflammatory changes. Vascular/Lymphatic: Aortic atherosclerosis. No enlarged abdominal or pelvic lymph nodes. Reproductive: A cystic lesion left adnexa measures 3.8 cm AP x 5.3 cm transverse x 3.6 cm craniocaudal. Uterus and right ovary are unremarkable. Other: None. Musculoskeletal: No acute or focal abnormality. IMPRESSION: There are a spectrum of findings in the lungs which can Rebecca Weeks seen with acute atypical infection (as well as other non-infectious etiologies). In particular, viral pneumonia (including COVID-19) should Rebecca Weeks considered in the appropriate clinical setting. Review of the patient's chart shows she is currently being tested for COVID-19. No acute abnormality abdomen or pelvis. Cystic lesion left adnexa cannot Rebecca Weeks definitively characterized. Nonemergent pelvic ultrasound is recommended for characterization. This recommendation follows ACR consensus guidelines: White Paper of the ACR Incidental Findings Committee II on Adnexal Findings. J Am Coll Radiol [phone]. Electronically Signed   By: Drusilla Kannerhomas  Dalessio M.D.   On: 01/15/2019 23:13   Dg Chest Port 1 View  Result Date: 01/15/2019 CLINICAL DATA:  Fever EXAM: PORTABLE CHEST 1 VIEW COMPARISON:  02/07/2008 FINDINGS: The cardiac silhouette is significantly enlarged. There are hazy bilateral airspace opacities, for example at the left lung base and in the peripheral mid lung zones bilaterally. There is no pneumothorax. No large pleural effusion. IMPRESSION: 1. Hazy bilateral airspace opacities as detailed above, highly concerning for an atypical (viral) pneumonia. 2. Cardiomegaly. Electronically Signed   By: Katherine Mantlehristopher  Green M.D.   On:  01/15/2019 21:28    Micro Results     Recent Results (from the past 240 hour(s))  SARS Coronavirus 2 (CEPHEID- Performed [facility] lab), Hosp Order     Status: None   Collection Time: 01/15/19  9:04 PM  Result Value Ref Range Status   SARS Coronavirus 2 NEGATIVE NEGATIVE Final    Comment: (NOTE) If result is NEGATIVE SARS-CoV-2 target nucleic acids are NOT DETECTED. The SARS-CoV-2  RNA is generally detectable in upper and lower  respiratory specimens during the acute phase of infection. The lowest  concentration of SARS-CoV-2 viral copies this assay can detect is 250  copies / mL. A negative result does not preclude SARS-CoV-2 infection  and should not Rebecca Weeks used as the sole basis for treatment or other  patient management decisions.  A negative result may occur with  improper specimen collection / handling, submission of specimen other  than nasopharyngeal swab, presence of viral mutation(s) within the  areas targeted by this assay, and inadequate number of viral copies  (<250 copies / mL). A negative result must Rebecca Weeks combined with clinical  observations, patient history, and epidemiological information. If result is POSITIVE SARS-CoV-2 target nucleic acids are DETECTED. The SARS-CoV-2 RNA is generally detectable in upper and lower  respiratory specimens dur ing the acute phase of infection.  Positive  results are indicative of active infection with SARS-CoV-2.  Clinical  correlation with patient history and other diagnostic information is  necessary to determine patient infection status.  Positive results do  not rule out bacterial infection or co-infection with other viruses. If result is PRESUMPTIVE POSTIVE SARS-CoV-2 nucleic acids MAY Lisaann PRESENT.   A presumptive positive result was obtained on the submitted specimen  and confirmed on repeat testing.  While 2019 novel coronavirus  (SARS-CoV-2) nucleic acids may Rebecca Weeks present in the submitted sample  additional  confirmatory testing may Rebecca Weeks necessary for epidemiological  and / or clinical management purposes  to differentiate between  SARS-CoV-2 and other Sarbecovirus currently known to infect humans.  If clinically indicated additional testing with an alternate test  methodology [phone]) is advised. The SARS-CoV-2 RNA is generally  detectable in upper and lower respiratory sp ecimens during the acute  phase of infection. The expected result is Negative. Fact Sheet for Patients:  BoilerBrush.com.cy Fact Sheet for Healthcare Providers: https://pope.com/ This test is not yet approved or cleared by the [place] FDA and has been authorized for detection and/or diagnosis of SARS-CoV-2 by FDA under an Emergency Use Authorization (EUA).  This EUA will remain in effect (meaning this test can Rebecca Weeks used) for the duration of the COVID-19 declaration under Section 564(b)(1) of the Act, 21 U.S.C. section 360bbb-3(b)(1), unless the authorization is terminated or revoked sooner. Performed at [facility], [address], [place], [place] [address]   SARS Coronavirus 2 [facility] order, Performed in [facility] hospital lab)     Status: Abnormal   Collection Time: 01/16/19  4:00 AM  Result Value Ref Range Status   SARS Coronavirus 2 POSITIVE (A) NEGATIVE Final    Comment: CRITICAL RESULT CALLED TO, READ BACK BY AND VERIFIED WITH: RN [facility] AT 8119 01/16/19 CRUICKSHANK A (NOTE) If result is NEGATIVE SARS-CoV-2 target nucleic acids are NOT DETECTED. The SARS-CoV-2 RNA is generally detectable in upper and lower  respiratory specimens during the acute phase of infection. The lowest  concentration of SARS-CoV-2 viral copies this assay can detect is 250  copies / mL. A negative result does not preclude SARS-CoV-2 infection  and should not Rebecca Weeks used as the sole basis for treatment or other  patient management decisions.  A negative result may  occur with  improper specimen collection / handling, submission of specimen other  than nasopharyngeal swab, presence of viral mutation(s) within the  areas targeted by this assay, and inadequate number of viral copies  (<250 copies / mL). A negative result must Rebecca Weeks combined with clinical  observations, patient  history, and epidemiological information. If result is POSITIVE SARS-CoV-2 target nucleic acids  are DETECTED. The SARS-CoV-2 RNA is generally detectable in upper and lower  respiratory specimens during the acute phase of infection.  Positive  results are indicative of active infection with SARS-CoV-2.  Clinical  correlation with patient history and other diagnostic information is  necessary to determine patient infection status.  Positive results do  not rule out bacterial infection or co-infection with other viruses. If result is PRESUMPTIVE POSTIVE SARS-CoV-2 nucleic acids MAY Rebecca Weeks PRESENT.   A presumptive positive result was obtained on the submitted specimen  and confirmed on repeat testing.  While 2019 novel coronavirus  (SARS-CoV-2) nucleic acids may Rebecca Weeks present in the submitted sample  additional confirmatory testing may Rebecca Weeks necessary for epidemiological  and / or clinical management purposes  to differentiate between  SARS-CoV-2 and other Sarbecovirus currently known to infect humans.  If clinically indicated additional testing with an alternate test  [facility] logy [phone]) is advised. The SARS-CoV-2 RNA is generally  detectable in upper and lower respiratory specimens during the acute  phase of infection. The expected result is Negative. Fact Sheet for Patients:  BoilerBrush.com.cy Fact Sheet for Healthcare Providers: https://pope.com/ This test is not yet approved or cleared by the [place] FDA and has been authorized for detection and/or diagnosis of SARS-CoV-2 by FDA under an Emergency Use Authorization (EUA).  This  EUA will remain in effect (meaning this test can Rebecca Weeks used) for the duration of the COVID-19 declaration under Section 564(b)(1) of the Act, 21 U.S.C. section 360bbb-3(b)(1), unless the authorization is terminated or revoked sooner. Performed at [facility], [address], [place], [place] [address]        Today   Subjective:   Rebecca Weeks today ports she is feeling better, denies any nausea, reports poor appetite, but it did improve, still having intermittent fever .  Objective:   Blood pressure 95/83, pulse 80, temperature 98.6 F (37 C), temperature source Oral, resp. rate 18, height  (1.549 m), weight 51 kg, SpO2 97 %.   Intake/Output Summary (Last 24 hours) at 01/18/2019 1058 Last data filed at 01/18/2019 1030 Gross per 24 hour  Intake 940 ml  Output -  Net 940 ml    Exam  Interpreter was used during  evaluation  Awake Alert, Oriented x 3, No new F.N deficits, Normal affect Symmetrical Chest wall movement, Good air movement bilaterally, CTAB RRR,No Gallops,Rubs or new Murmurs, No Parasternal Heave +ve B.Sounds, Abd Soft, Non tender,No rebound -guarding or rigidity. No Cyanosis, Clubbing or edema, No new Rash or bruise  Data Review   CBC w Diff:  Lab Results  Component Value Date   WBC 7.8 01/18/2019   HGB 9.4 (L) 01/18/2019   HCT 27.6 (L) 01/18/2019   PLT 205 01/18/2019   LYMPHOPCT 13 01/18/2019   MONOPCT 2 01/18/2019   EOSPCT 0 01/18/2019   BASOPCT 0 01/18/2019    CMP:  Lab Results  Component Value Date   NA 140 01/18/2019   K 3.7 01/18/2019   CL 110 01/18/2019   CO2 22 01/18/2019   BUN 8 01/18/2019   CREATININE 0.77 01/18/2019   CREATININE 0.81 04/18/2013   PROT 5.7 (L) 01/18/2019   ALBUMIN 2.5 (L) 01/18/2019   BILITOT 0.7 01/18/2019   ALKPHOS 58 01/18/2019   AST 35 01/18/2019   ALT 19 01/18/2019  .   Total Time in preparing paper work, data evaluation and todays exam - 35  minutes  Huey Bienenstock M.D on  01/18/2019 at 10:58 AM  [facility]   Office  [phone]

## 2019-01-24 ENCOUNTER — Ambulatory Visit: Payer: Medicare Other | Attending: Primary Care | Admitting: Primary Care

## 2019-01-24 ENCOUNTER — Other Ambulatory Visit: Payer: Self-pay

## 2019-02-03 ENCOUNTER — Telehealth: Payer: Self-pay | Admitting: *Deleted

## 2019-02-03 NOTE — Telephone Encounter (Signed)
Patient verified DOB Patient is aware of visit being a tele.

## 2019-02-04 ENCOUNTER — Ambulatory Visit: Payer: Medicare Other | Attending: Primary Care | Admitting: Primary Care

## 2019-02-04 ENCOUNTER — Other Ambulatory Visit: Payer: Self-pay

## 2019-02-04 DIAGNOSIS — M79606 Pain in leg, unspecified: Secondary | ICD-10-CM | POA: Diagnosis not present

## 2019-02-04 DIAGNOSIS — Z09 Encounter for follow-up examination after completed treatment for conditions other than malignant neoplasm: Secondary | ICD-10-CM | POA: Diagnosis not present

## 2019-02-04 DIAGNOSIS — E782 Mixed hyperlipidemia: Secondary | ICD-10-CM | POA: Insufficient documentation

## 2019-02-04 DIAGNOSIS — Z7689 Persons encountering health services in other specified circumstances: Secondary | ICD-10-CM | POA: Diagnosis not present

## 2019-02-04 DIAGNOSIS — M549 Dorsalgia, unspecified: Secondary | ICD-10-CM | POA: Diagnosis not present

## 2019-02-04 DIAGNOSIS — I10 Essential (primary) hypertension: Secondary | ICD-10-CM | POA: Diagnosis present

## 2019-02-04 DIAGNOSIS — Z20822 Contact with and (suspected) exposure to covid-19: Secondary | ICD-10-CM

## 2019-02-04 DIAGNOSIS — Z20828 Contact with and (suspected) exposure to other viral communicable diseases: Secondary | ICD-10-CM

## 2019-02-04 NOTE — Progress Notes (Signed)
Virtual Visit via Telephone Note  I connected with [name] on 02/05/19 at  9:30 AM EDT by telephone and verified that I am speaking with the correct person using two identifiers.   I discussed the limitations, risks, security and privacy concerns of performing an evaluation and management service by telephone and the availability of in person appointments. I also discussed with the patient that there may Gyanna a patient responsible charge related to this service. The patient expressed understanding and agreed to proceed.  History of Present Illness: [name] was admitted to the hospital for fever and fatigue.Dx for pneumonia. Her husband was admitted to [facility] for COVID +.  Son name is Moi mother gives him permission to speak on her behalf. She speaks [place].  Past medical history : HTN. and language barrier. Establish  Care.  Observations/Objective: Review of Systems  Constitutional: Positive for malaise/fatigue.  HENT: Negative.   Eyes: Negative.   Respiratory: Negative.   Cardiovascular: Negative.   Gastrointestinal: Negative.   Genitourinary: Negative.   Musculoskeletal: Negative.   Skin: Negative.     Assessment and Plan: Diagnoses and all orders for this visit:  Hospital discharge follow-up  Admitted for Viral pneumonia was admission dx. Initially she had 2-3 days of fever  n/v and light headed. Reviewing Pneumonia Severity  Index she meet criteria. Labs on f/u.  Essential hypertension, benign Hosp d/c wanted f/u labs with PCP done. Patient is not on any Bp meds at this time. When patient presents will have Bp ck -     CBC with Differential/Platelet; Future -     Comprehensive metabolic panel; Future -     Lipid panel; Future  Mixed hyperlipidemia Hx of elevated Chol and TG -     Lipid panel; Future  Encounter to establish care No PCP on file patient last PCP encounter 2014  Close Exposure to COVID  Husband is positive for COVID in [facility]  Follow Up Instructions:    I discussed the assessment and treatment plan with the patient. The patient was provided an opportunity to ask questions and all were answered. The patient agreed with the plan and demonstrated an understanding of the instructions.   The patient was advised to call back or seek an in-person evaluation if the symptoms worsen or if the condition fails to improve as anticipated.  I provided  20 minutes of non-face-to-face time during this encounter. This includes reviewing hospital visit , images and labs   Grayce Sessions, NP

## 2019-02-04 NOTE — Progress Notes (Signed)
Patient verified DOB Patient has not taken medication today. Patient has eaten today. Patient complains of back pain and leg pain being present and currently scaled at 8 and increases at night.

## 2019-02-05 DIAGNOSIS — Z20822 Contact with and (suspected) exposure to covid-19: Secondary | ICD-10-CM | POA: Insufficient documentation

## 2019-02-05 DIAGNOSIS — Z20828 Contact with and (suspected) exposure to other viral communicable diseases: Secondary | ICD-10-CM | POA: Insufficient documentation

## 2019-02-06 ENCOUNTER — Inpatient Hospital Stay: Payer: Medicare Other | Admitting: Primary Care

## 2019-02-13 ENCOUNTER — Telehealth: Payer: Self-pay | Admitting: *Deleted

## 2019-02-13 NOTE — Telephone Encounter (Signed)
Opened to request plasma donation however unable to get an interpreter for [employer] language.

## 2021-05-10 IMAGING — CT CT ABDOMEN AND PELVIS WITHOUT CONTRAST
2 of 4 series · 15 of 46 positions shown, 17 images · non-contrast
Comparison: None.

CLINICAL DATA: Fever and right lower quadrant pain.

EXAM:
CT ABDOMEN AND PELVIS WITHOUT CONTRAST
TECHNIQUE: Multidetector CT imaging of the abdomen and pelvis was performed
following the standard protocol without IV contrast.

[Series 2: axial st · axial · 0.61mm/px · z∈[-484,-144]mm · 12 of 78 slices shown, 14 images]
[im 5/78  soft-tissue]
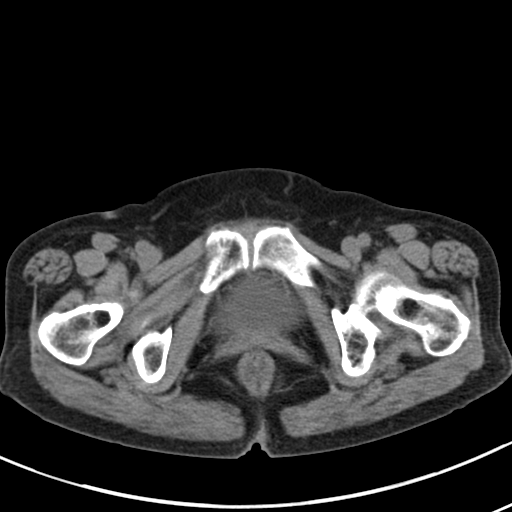
[im 5/78  bone]
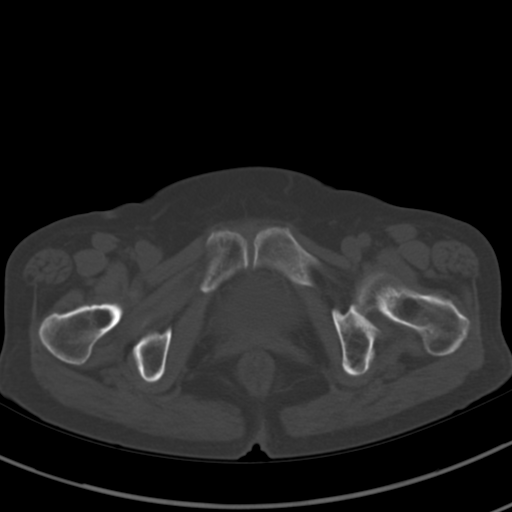
[im 13/78  soft-tissue]
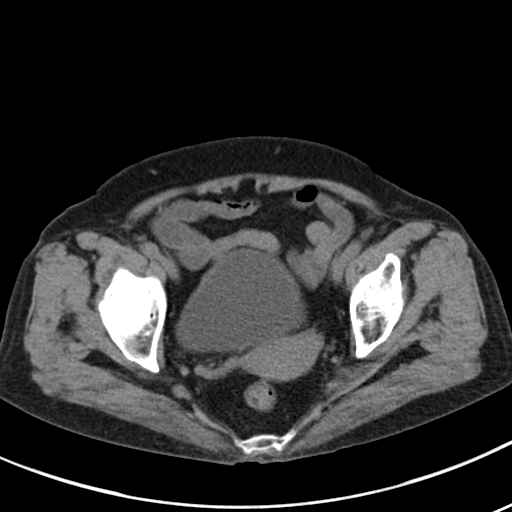
[im 17/78  soft-tissue]
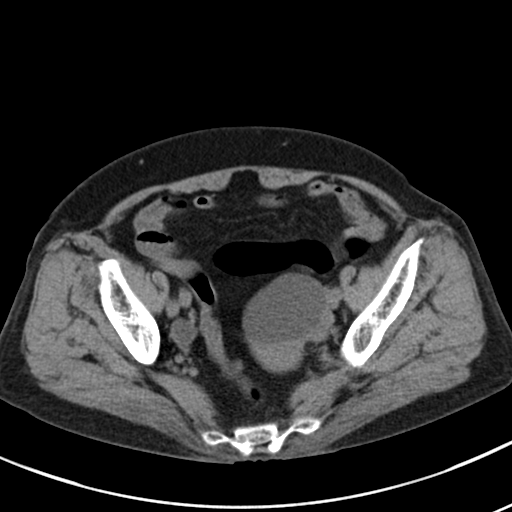
[im 25/78  soft-tissue]
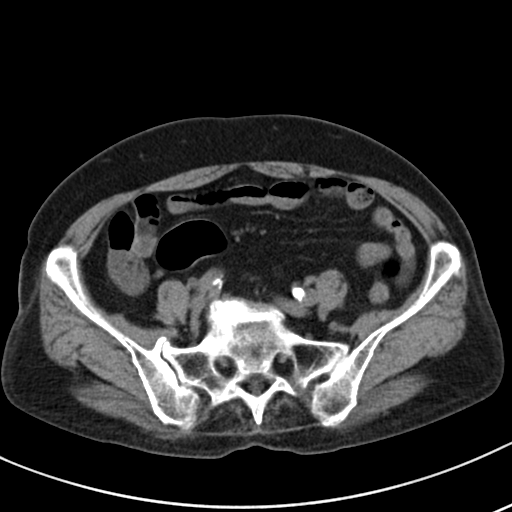
[im 29/78  soft-tissue]
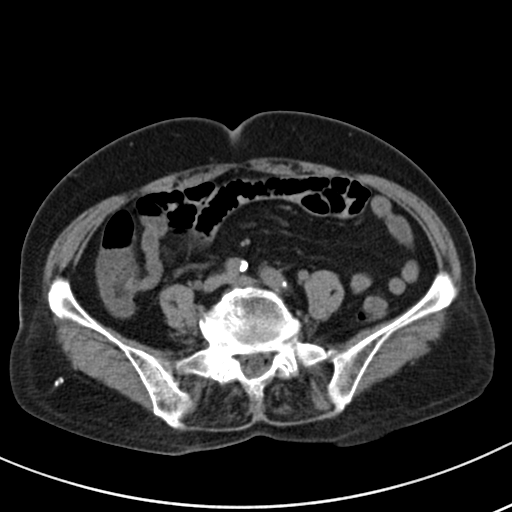
[im 37/78  soft-tissue]
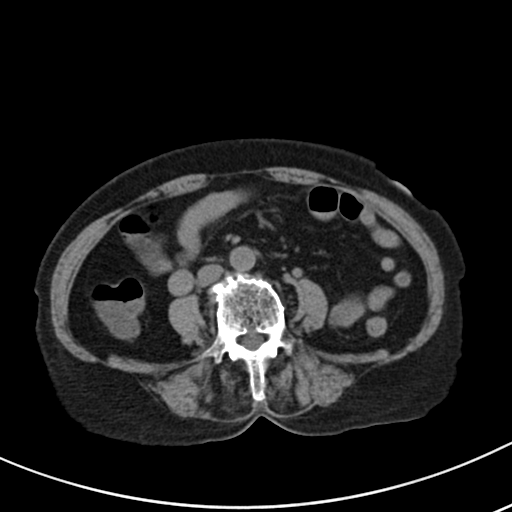
[im 41/78  soft-tissue]
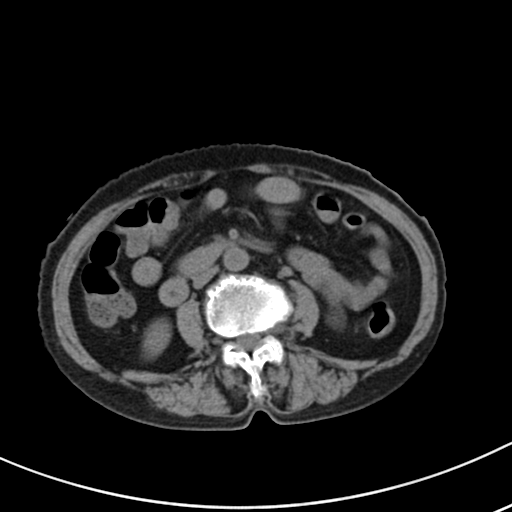
[im 49/78  soft-tissue]
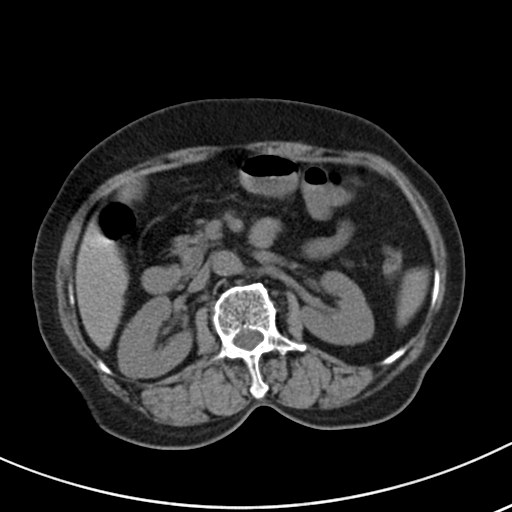
[im 53/78  soft-tissue]
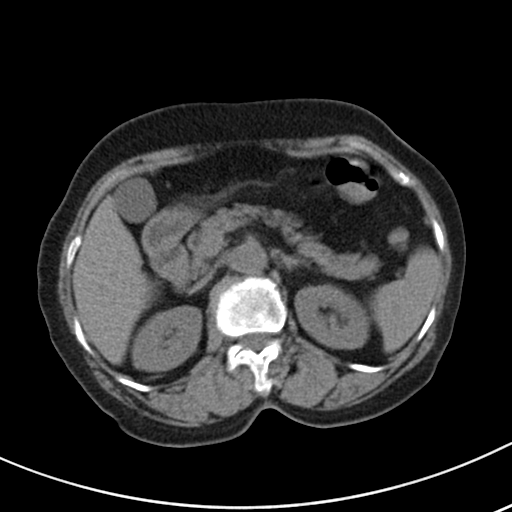
[im 53/78  bone]
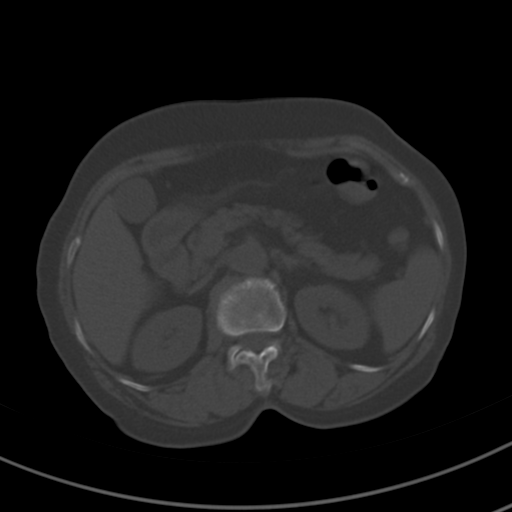
[im 61/78  soft-tissue]
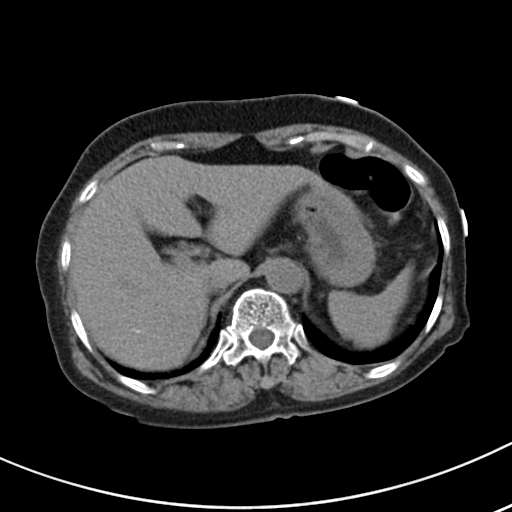
[im 65/78  soft-tissue]
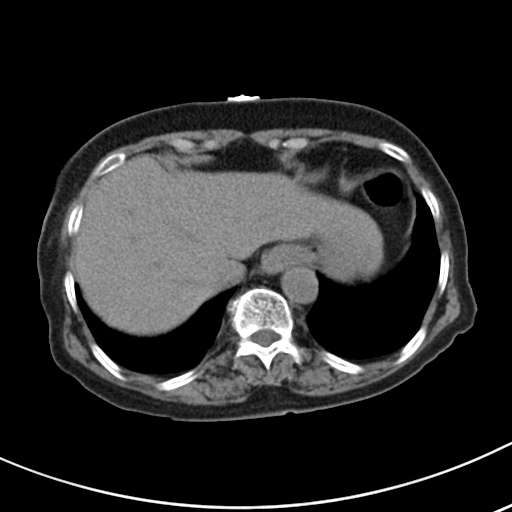
[im 73/78  soft-tissue]
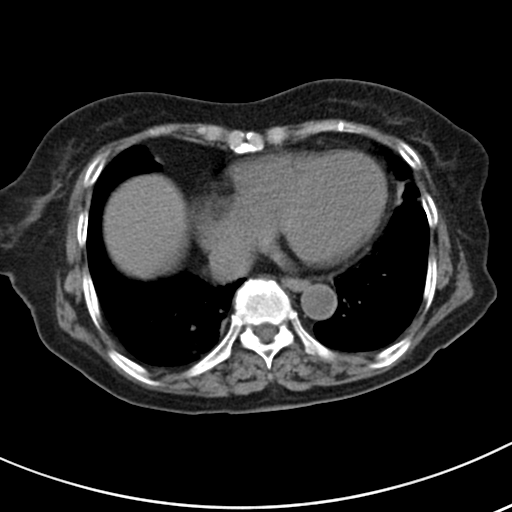

[Series 5: coronal st · coronal · 0.56mm/px · 3 of 109 slices shown]
[im 37/109  soft-tissue]
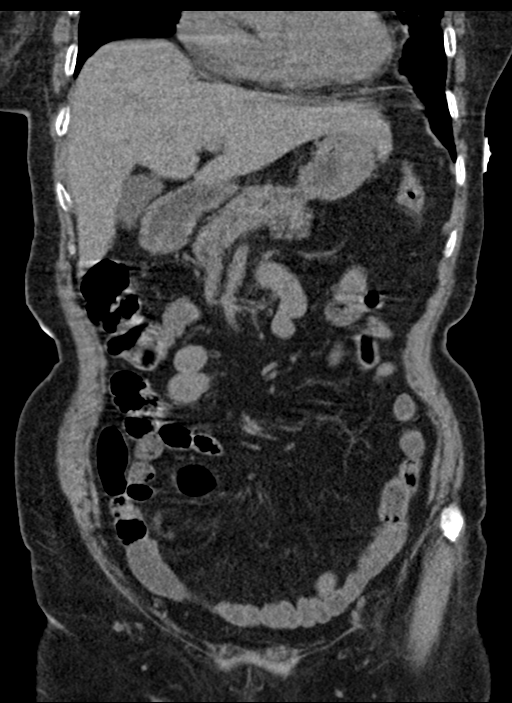
[im 49/109  soft-tissue]
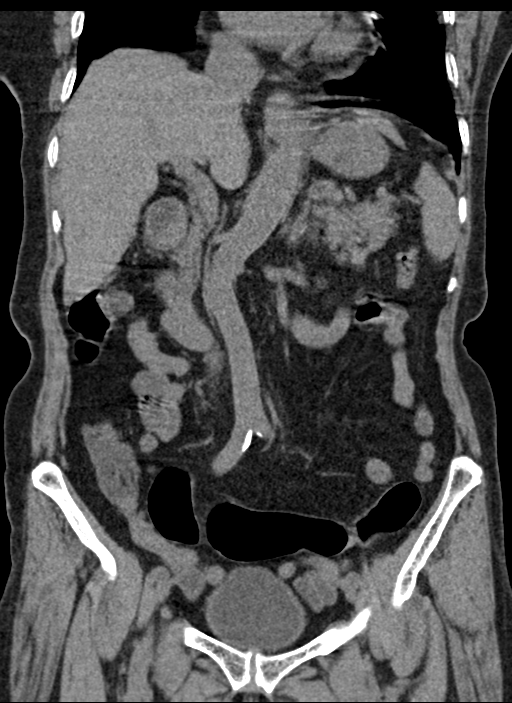
[im 61/109  soft-tissue]
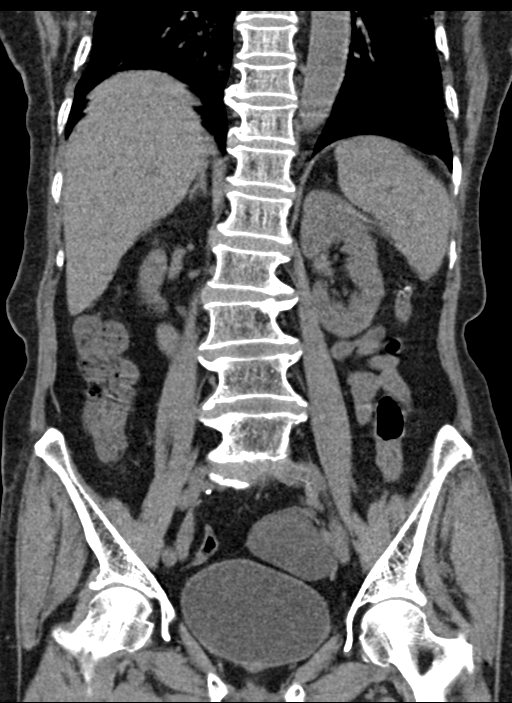

[15 of 46 positions shown; findings below may reference images not displayed]

FINDINGS: Lower chest: Peripheral ground-glass opacities are seen in the lower
lobes bilaterally and in the periphery of the lingula and right
middle lobe. No pleural effusion. There is cardiomegaly.

Hepatobiliary: No focal liver abnormality is seen. No gallstones,
gallbladder wall thickening, or biliary dilatation.

Pancreas: Unremarkable. No pancreatic ductal dilatation or
surrounding inflammatory changes.

Spleen: Normal in size without focal abnormality.

Adrenals/Urinary Tract: Adrenal glands are unremarkable. Kidneys are
normal, without renal calculi, focal lesion, or hydronephrosis.
Bladder is unremarkable.

Stomach/Bowel: Stomach is within normal limits. Appendix appears
normal. No evidence of bowel wall thickening, distention, or
inflammatory changes.

Vascular/Lymphatic: Aortic atherosclerosis. No enlarged abdominal or
pelvic lymph nodes.

Reproductive: A cystic lesion left adnexa measures 3.8 cm AP x
cm transverse x 3.6 cm craniocaudal. Uterus and right ovary are
unremarkable.

Other: None.

Musculoskeletal: No acute or focal abnormality.
IMPRESSION: There are a spectrum of findings in the lungs which can be seen with
acute atypical infection (as well as other non-infectious
etiologies). In particular, viral pneumonia (including BGU2C-8T)
should be considered in the appropriate clinical setting. Review of
the patient's chart shows she is currently being tested for
BGU2C-8T.

No acute abnormality abdomen or pelvis.

Cystic lesion left adnexa cannot be definitively characterized.
Nonemergent pelvic ultrasound is recommended for characterization.
This recommendation follows ACR consensus guidelines: White Paper of
the ACR Incidental Findings Committee II on Adnexal Findings. [HOSPITAL] [DATE].

## 2021-05-10 IMAGING — DX PORTABLE CHEST - 1 VIEW
1 series · 1 of 1 positions shown · non-contrast
Comparison: 02/07/2008

CLINICAL DATA: Fever

EXAM:
PORTABLE CHEST 1 VIEW

[chest ap]
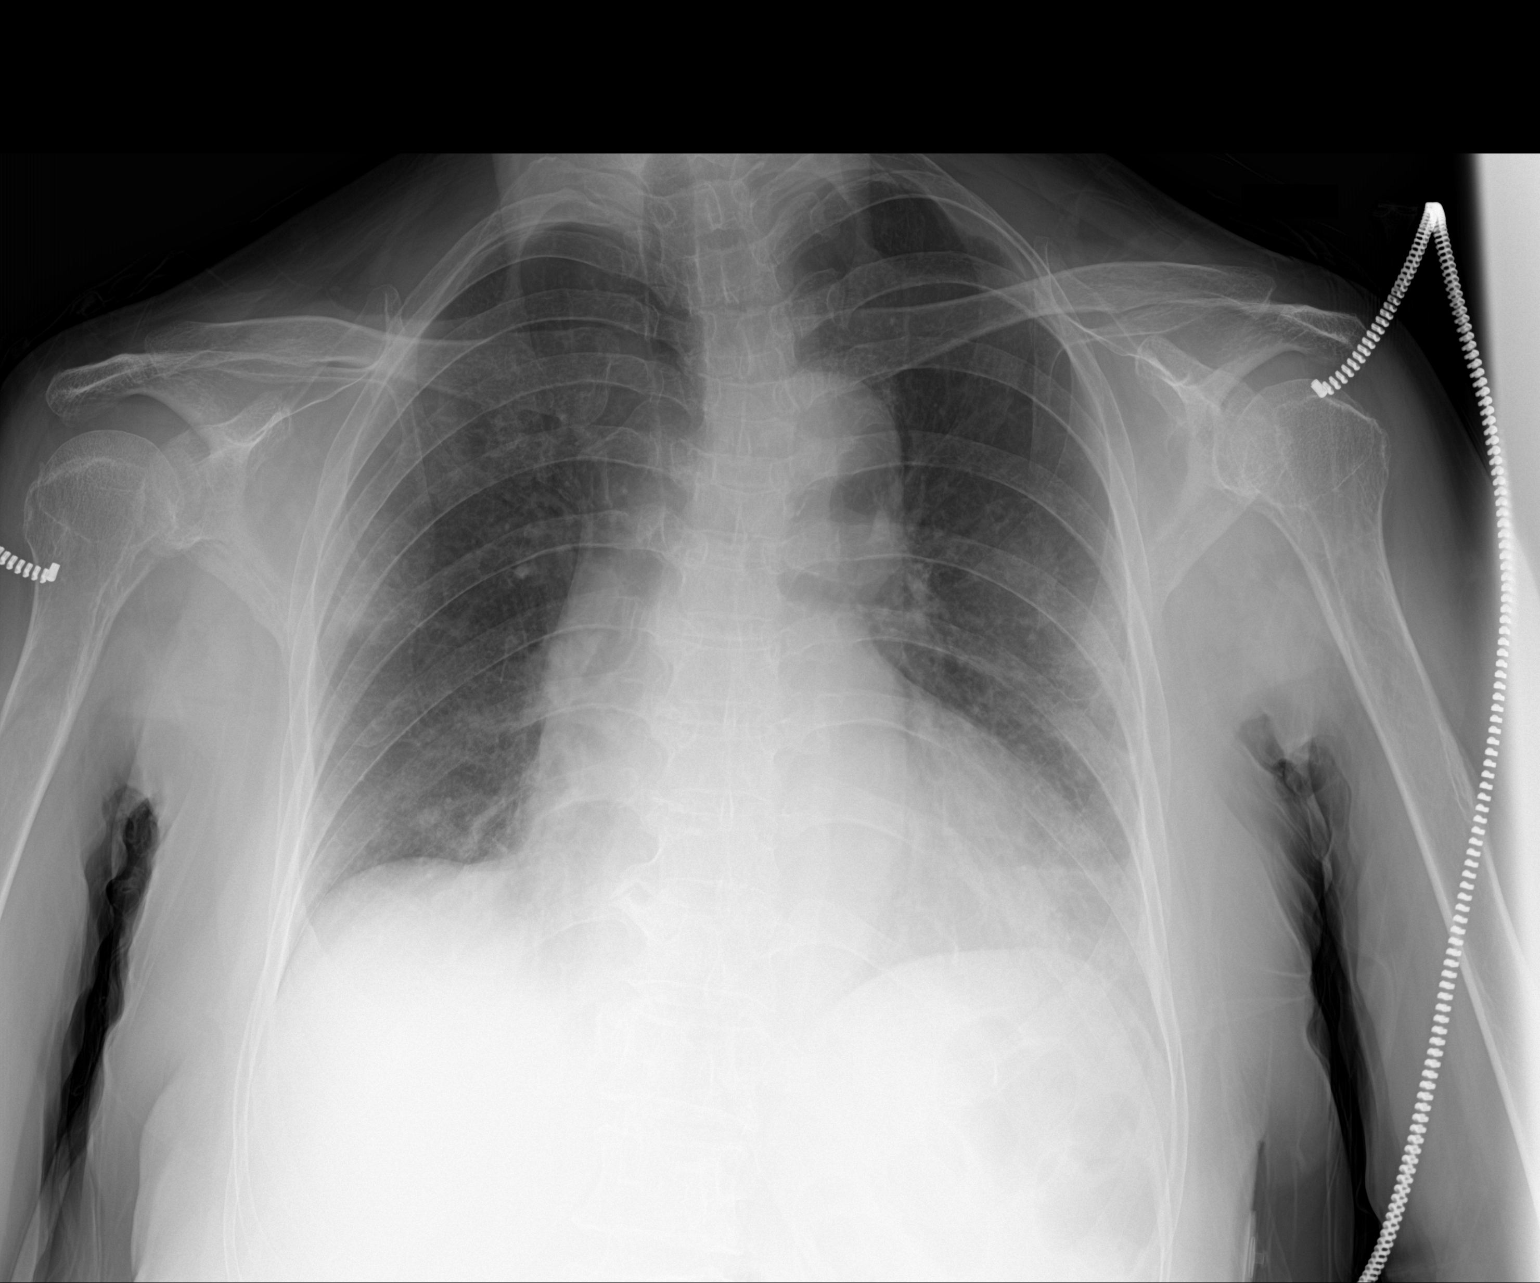

[1 of 1 positions shown; findings below may reference images not displayed]

FINDINGS: The cardiac silhouette is significantly enlarged. There are hazy
bilateral airspace opacities, for example at the left lung base and
in the peripheral mid lung zones bilaterally. There is no
pneumothorax. No large pleural effusion.
IMPRESSION: 1. Hazy bilateral airspace opacities as detailed above, highly
concerning for an atypical (viral) pneumonia.
2. Cardiomegaly.

## 2022-05-30 ENCOUNTER — Other Ambulatory Visit: Payer: Self-pay | Admitting: Registered Nurse

## 2022-05-30 ENCOUNTER — Ambulatory Visit
Admission: RE | Admit: 2022-05-30 | Discharge: 2022-05-30 | Disposition: A | Discharge status: Home / Self Care | Payer: Medicare Other | Source: Ambulatory Visit | Attending: Registered Nurse | Admitting: Registered Nurse

## 2022-05-30 DIAGNOSIS — G8929 Other chronic pain: Secondary | ICD-10-CM

## 2022-05-30 DIAGNOSIS — M25551 Pain in right hip: Secondary | ICD-10-CM

## 2022-05-30 DIAGNOSIS — M25561 Pain in right knee: Secondary | ICD-10-CM

## 2022-11-08 ENCOUNTER — Other Ambulatory Visit: Payer: Self-pay | Admitting: Registered Nurse

## 2022-11-08 DIAGNOSIS — E2839 Other primary ovarian failure: Secondary | ICD-10-CM
